# Patient Record
Sex: Male | Born: 1990 | Race: White | Hispanic: No | Marital: Single | Smoking: Current every day smoker
Health system: Southern US, Community
[De-identification: ages and names within clinical notes are randomized; demographics above are authoritative.]

## PROBLEM LIST (undated history)

## (undated) HISTORY — PX: OTHER SURGICAL HISTORY: SHX169

---
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
## 2010-01-26 ENCOUNTER — Emergency Department (HOSPITAL_COMMUNITY): Admission: EM | Admit: 2010-01-26 | Discharge: 2010-01-26 | Payer: Self-pay | Admitting: Emergency Medicine

## 2010-09-16 ENCOUNTER — Emergency Department (HOSPITAL_COMMUNITY)
Admission: EM | Admit: 2010-09-16 | Discharge: 2010-09-16 | Payer: Self-pay | Source: Home / Self Care | Admitting: Family Medicine

## 2011-02-18 LAB — POCT URINALYSIS DIPSTICK
Glucose, UA: NEGATIVE mg/dL
Hgb urine dipstick: NEGATIVE
Nitrite: NEGATIVE
Protein, ur: 100 mg/dL — AB
Specific Gravity, Urine: >=1.03 (ref 1.005–1.030)
Urobilinogen, UA: 4 mg/dL — ABNORMAL HIGH (ref 0.0–1.0)
pH: 6 (ref 5.0–8.0)

## 2011-02-24 LAB — URINALYSIS, ROUTINE W REFLEX MICROSCOPIC
Bilirubin Urine: NEGATIVE
Glucose, UA: NEGATIVE mg/dL
Hgb urine dipstick: NEGATIVE
Ketones, ur: NEGATIVE mg/dL
pH: 8 (ref 5.0–8.0)

## 2013-11-19 ENCOUNTER — Emergency Department (HOSPITAL_COMMUNITY): Payer: Self-pay

## 2013-11-19 ENCOUNTER — Emergency Department (HOSPITAL_COMMUNITY)
Admission: EM | Admit: 2013-11-19 | Discharge: 2013-11-19 | Disposition: A | Discharge status: Home / Self Care | Payer: Self-pay | Attending: Emergency Medicine | Admitting: Emergency Medicine

## 2013-11-19 DIAGNOSIS — IMO0002 Reserved for concepts with insufficient information to code with codable children: Secondary | ICD-10-CM | POA: Insufficient documentation

## 2013-11-19 DIAGNOSIS — Z881 Allergy status to other antibiotic agents status: Secondary | ICD-10-CM | POA: Insufficient documentation

## 2013-11-19 DIAGNOSIS — Y9389 Activity, other specified: Secondary | ICD-10-CM | POA: Insufficient documentation

## 2013-11-19 DIAGNOSIS — Y990 Civilian activity done for income or pay: Secondary | ICD-10-CM | POA: Insufficient documentation

## 2013-11-19 DIAGNOSIS — M25549 Pain in joints of unspecified hand: Secondary | ICD-10-CM | POA: Insufficient documentation

## 2013-11-19 DIAGNOSIS — M79641 Pain in right hand: Secondary | ICD-10-CM

## 2013-11-19 DIAGNOSIS — Z880 Allergy status to penicillin: Secondary | ICD-10-CM | POA: Insufficient documentation

## 2013-11-19 DIAGNOSIS — Y929 Unspecified place or not applicable: Secondary | ICD-10-CM | POA: Insufficient documentation

## 2013-11-19 DIAGNOSIS — M7989 Other specified soft tissue disorders: Secondary | ICD-10-CM | POA: Insufficient documentation

## 2013-11-19 NOTE — ED Provider Notes (Signed)
CSN: [id number]     Arrival date & time 11/19/13  1253 History   First MD Initiated Contact with Patient 11/19/13 1345    This chart was scribed for Grant Horseman PA-C, a non-physician practitioner working with Laray Anger, DO by Lewanda Rife, ED Scribe. This patient was seen in room TR07C/TR07C and the patient's care was started at 2:42 PM     Chief Complaint  Patient presents with  . Hand Pain   (Consider location/radiation/quality/duration/timing/severity/associated sxs/prior Treatment) The history is provided by the patient. No language interpreter was used.  HPI Comments: TEO MOEDE is a 22 y.o. male who presents to the Emergency Department complaining of constant moderate right hand pain onset today while using socket a wrench his hand slipped causing him to hit his hand on the ulnar aspect of affected hand. 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 Reports associated mild swelling. Reports pain is exacerbated with movement and alleviated by nothing. Denies associated fever, numbness, and other trauma.    No past medical history on file. No past surgical history on file. No family history on file. History  Substance Use Topics  . Smoking status: Not on file  . Smokeless tobacco: Not on file  . Alcohol Use: Not on file    Review of Systems  Constitutional: Negative for fever.  Musculoskeletal:       Right hand pain   Psychiatric/Behavioral: Negative for confusion.   A complete 10 system review of systems was obtained and all systems are negative except as noted in the HPI and PMHx.    Allergies  Amoxicillin and Penicillins  Home Medications   Current Outpatient Rx  Name  Route  Sig  Dispense  Refill  . naproxen sodium (ANAPROX) 220 MG tablet   Oral   Take 220 mg by mouth once.          BP 131/67  Pulse 82  Temp(Src) 98.1 F (36.7 C) (Oral)  Resp 16  Wt 228 lb 8 oz (103.647 kg)  SpO2 98% Physical Exam  Nursing note and vitals  reviewed. Constitutional: He is oriented to person, place, and time. He appears well-developed and well-nourished. No distress.  HENT:  Head: Normocephalic and atraumatic.  Eyes: EOM are normal.  Neck: Neck supple. No tracheal deviation present.  Cardiovascular: Normal rate, regular rhythm, intact distal pulses and normal pulses.   Pulses:      Radial pulses are 2+ on the right side.  Pulmonary/Chest: Effort normal. No respiratory distress.  Musculoskeletal: Normal range of motion.  ROM, strength 5/5, mild tenderness to palpation, no palpable deformity or abnormality  Neurological: He is alert and oriented to person, place, and time.  Sensation intact  Skin: Skin is warm and dry.  Psychiatric: He has a normal mood and affect. His behavior is normal.    ED Course  Procedures (including critical care time)  COORDINATION OF CARE:  Nursing notes reviewed. Vital signs reviewed. Initial pt interview and examination performed.   2:20 PM-Discussed work up plan with pt at bedside, which includes x-ray of right hand . Pt agrees with plan.  2:42 PM Nursing Notes Reviewed/ Care Coordinated Applicable Imaging Reviewed and incorporated into ED treatment Discussed results and treatment plan with pt. Pt demonstrates understanding and agrees with plan.  Treatment plan initiated:Medications - No data to display   Initial diagnostic testing ordered.    Labs Review Labs Reviewed - No data to display Imaging Review Dg Hand Complete Right  11/19/2013  CLINICAL DATA:  Work injury.  Pain.  EXAM: RIGHT HAND - COMPLETE 3+ VIEW  COMPARISON:  None.  FINDINGS: There is no evidence of fracture or dislocation. There is no evidence of arthropathy or other focal bone abnormality. Mild lateral soft tissue swelling.  IMPRESSION: Negative for fracture.   Electronically Signed   By: Davonna Belling M.D.   On: 11/19/2013 13:26    EKG Interpretation   None       MDM   1. Hand pain, right    Hand pain.   No fracture.  Wrist splint.  PCP follow-up.  I personally performed the services described in this documentation, which was scribed in my presence. The recorded information has been reviewed and is accurate.     Grant Horseman, PA-C 11/19/13 [phone]

## 2013-11-19 NOTE — ED Notes (Signed)
Hurt right hand at work today hurts to move hand now

## 2013-11-20 NOTE — ED Provider Notes (Signed)
Medical screening examination/treatment/procedure(s) were performed by non-physician practitioner and as supervising physician I was immediately available for consultation/collaboration.  EKG Interpretation   None         Laray Anger, DO 11/20/13 [phone]

## 2014-05-02 ENCOUNTER — Encounter (HOSPITAL_COMMUNITY): Payer: Self-pay | Admitting: Emergency Medicine

## 2014-05-02 ENCOUNTER — Emergency Department (HOSPITAL_COMMUNITY)
Admission: EM | Admit: 2014-05-02 | Discharge: 2014-05-03 | Disposition: A | Discharge status: Home / Self Care | Payer: Self-pay | Attending: Emergency Medicine | Admitting: Emergency Medicine

## 2014-05-02 DIAGNOSIS — Z791 Long term (current) use of non-steroidal anti-inflammatories (NSAID): Secondary | ICD-10-CM | POA: Insufficient documentation

## 2014-05-02 DIAGNOSIS — Y929 Unspecified place or not applicable: Secondary | ICD-10-CM | POA: Insufficient documentation

## 2014-05-02 DIAGNOSIS — IMO0002 Reserved for concepts with insufficient information to code with codable children: Secondary | ICD-10-CM

## 2014-05-02 DIAGNOSIS — W268XXA Contact with other sharp object(s), not elsewhere classified, initial encounter: Secondary | ICD-10-CM | POA: Insufficient documentation

## 2014-05-02 DIAGNOSIS — Z880 Allergy status to penicillin: Secondary | ICD-10-CM | POA: Insufficient documentation

## 2014-05-02 DIAGNOSIS — F17200 Nicotine dependence, unspecified, uncomplicated: Secondary | ICD-10-CM | POA: Insufficient documentation

## 2014-05-02 DIAGNOSIS — Y9389 Activity, other specified: Secondary | ICD-10-CM | POA: Insufficient documentation

## 2014-05-02 DIAGNOSIS — Z23 Encounter for immunization: Secondary | ICD-10-CM | POA: Insufficient documentation

## 2014-05-02 DIAGNOSIS — S61209A Unspecified open wound of unspecified finger without damage to nail, initial encounter: Secondary | ICD-10-CM | POA: Insufficient documentation

## 2014-05-02 DIAGNOSIS — Y990 Civilian activity done for income or pay: Secondary | ICD-10-CM | POA: Insufficient documentation

## 2014-05-02 NOTE — ED Notes (Signed)
Pt cut right thumb on piece of metal; no bleeding noted; edges well approximated. 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 Reports that he had a cut there previously. Done around 1800.

## 2014-05-03 ENCOUNTER — Encounter (HOSPITAL_COMMUNITY): Payer: Self-pay | Admitting: Emergency Medicine

## 2014-05-03 MED ORDER — TETANUS-DIPHTH-ACELL PERTUSSIS 5-2.5-18.5 LF-MCG/0.5 IM SUSP
0.5000 mL | Freq: Once | INTRAMUSCULAR | Status: AC
  Administered 2014-05-03: 0.5 mL via INTRAMUSCULAR
  Filled 2014-05-03: qty 0.5

## 2014-05-03 NOTE — ED Provider Notes (Signed)
CSN: [id number]     Arrival date & time 05/02/14  2041 History  This chart was scribed for non-physician practitioner Junious Silk, PA working with Junius Argyle, MD by Elveria Rising, ED Scribe. This patient was seen in room TR09C/TR09C and the patient's care was started at 12:22 AM.   Chief Complaint  Patient presents with  . Extremity Laceration     The history is provided by the patient. No language interpreter was used.   HPI Comments: Grant Evans is a 23 y.o. male who presents to the Emergency Department with laceration to right thumb which occured while at work today.  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 It scraped his skin, bumped up and then went back into his skin. Patient experiencing throbbing pain and intermittent tingling and numbness. Patient reports flushing the wound with water to expel any potential foreign bodies. He does not believe there are any retained foreign bodies.   Patient due for updated Tetanus vaccination.  History reviewed. No pertinent past medical history. History reviewed. No pertinent past surgical history. History reviewed. No pertinent family history. History  Substance Use Topics  . Smoking status: Current Every Day Smoker -- 1.00 packs/day  . Smokeless tobacco: Not on file  . Alcohol Use: Not on file    Review of Systems  Skin:       Laceration  All other systems reviewed and are negative.     Allergies  Amoxicillin and Penicillins  Home Medications   Prior to Admission medications   Medication Sig Start Date End Date Taking? Authorizing Provider  naproxen sodium (ANAPROX) 220 MG tablet Take 220 mg by mouth once.   Yes Historical Provider, MD   Triage Vitals: BP 122/65  Pulse 86  Temp(Src) 98.9 F (37.2 C) (Oral)  Resp 18  SpO2 99% Physical Exam  Nursing note and vitals reviewed. Constitutional: He is oriented to person, place, and time. He appears well-developed and  well-nourished. No distress.  HENT:  Head: Normocephalic and atraumatic.  Right Ear: External ear normal.  Left Ear: External ear normal.  Nose: Nose normal.  Eyes: Conjunctivae are normal.  Neck: Normal range of motion. No tracheal deviation present.  Cardiovascular: Normal rate, regular rhythm and normal heart sounds.   Pulmonary/Chest: Effort normal and breath sounds normal. No stridor.  Abdominal: Soft. He exhibits no distension. There is no tenderness.  Musculoskeletal: Normal range of motion.  Superficial laceration measuring 76mm in length. No foreign bodies appreciated. Bottom of the wound visualized. Cap refill < 3. Sensation intact. Full AROM.  Neurological: He is alert and oriented to person, place, and time.  Skin: Skin is warm and dry. He is not diaphoretic.  Psychiatric: He has a normal mood and affect. His behavior is normal.    ED Course  Procedures (including critical care time) DIAGNOSTIC STUDIES: Oxygen Saturation is 99% on room air, normal by my interpretation.    COORDINATION OF CARE: 12:24 AM- Discussed treatment plan with patient at bedside and patient agreed to plan.     Labs Review Labs Reviewed - No data to display  Imaging Review No results found.   EKG Interpretation None      MDM   Final diagnoses:  Laceration    Patient presents for evaluation of laceration. The laceration is very superficial. It measures approximately 5 mm in diameter. The bottle the wound was visualized and there no retained foreign bodies. I palpated the area  around the wound. The wound was cleaned here and the patient's tetanus shot was updated. Patient has full active range of motion and use of his hand. A work note was filled out for the patient per his request. Discussed reasons to return to the emergency department immediately. Vital signs stable for discharge. Patient / Family / Caregiver informed of clinical course, understand medical decision-making process, and  agree with plan.   I personally performed the services described in this documentation, which was scribed in my presence. The recorded information has been reviewed and is accurate.    Mora BellmanHannah S Kallan Bischoff, PA-C 05/03/14 [phone]

## 2014-05-03 NOTE — ED Provider Notes (Signed)
Medical screening examination/treatment/procedure(s) were performed by non-physician practitioner and as supervising physician I was immediately available for consultation/collaboration.   EKG Interpretation None        Junius Argyle, MD 05/03/14 0930

## 2014-05-03 NOTE — Discharge Instructions (Signed)

## 2014-05-03 NOTE — ED Notes (Signed)
Family member and pt at desk once again inquiring about the wait.  This Rn informed pt about process.  Family member rolling eyes and making rude remarks to RN, "y'all need more help around here.  This is ridiculous."  Pt and family member returned to room slamming door.

## 2014-05-03 NOTE — ED Notes (Signed)
PA at bedside.

## 2014-08-29 ENCOUNTER — Encounter (HOSPITAL_COMMUNITY): Payer: Self-pay | Admitting: Emergency Medicine

## 2014-08-29 DIAGNOSIS — F17200 Nicotine dependence, unspecified, uncomplicated: Secondary | ICD-10-CM | POA: Insufficient documentation

## 2014-08-29 DIAGNOSIS — R109 Unspecified abdominal pain: Secondary | ICD-10-CM | POA: Insufficient documentation

## 2014-08-29 DIAGNOSIS — R197 Diarrhea, unspecified: Secondary | ICD-10-CM | POA: Insufficient documentation

## 2014-08-29 DIAGNOSIS — K921 Melena: Secondary | ICD-10-CM | POA: Insufficient documentation

## 2014-08-29 DIAGNOSIS — Z880 Allergy status to penicillin: Secondary | ICD-10-CM | POA: Insufficient documentation

## 2014-08-29 LAB — COMPREHENSIVE METABOLIC PANEL
ALBUMIN: 4.1 g/dL (ref 3.5–5.2)
ALK PHOS: 51 U/L (ref 39–117)
ALT: 21 U/L (ref 0–53)
AST: 24 U/L (ref 0–37)
Anion gap: 15 (ref 5–15)
BUN: 12 mg/dL (ref 6–23)
CALCIUM: 9.2 mg/dL (ref 8.4–10.5)
CO2: 24 mEq/L (ref 19–32)
Chloride: 103 mEq/L (ref 96–112)
Creatinine, Ser: 0.86 mg/dL (ref 0.50–1.35)
GFR calc non Af Amer: >90 mL/min (ref >90)
GLUCOSE: 118 mg/dL — AB (ref 70–99)
POTASSIUM: 3.5 meq/L — AB (ref 3.7–5.3)
SODIUM: 142 meq/L (ref 137–147)
TOTAL PROTEIN: 7.6 g/dL (ref 6.0–8.3)
Total Bilirubin: 0.4 mg/dL (ref 0.3–1.2)

## 2014-08-29 LAB — URINALYSIS, ROUTINE W REFLEX MICROSCOPIC
Bilirubin Urine: NEGATIVE
GLUCOSE, UA: NEGATIVE mg/dL
HGB URINE DIPSTICK: NEGATIVE
Ketones, ur: NEGATIVE mg/dL
LEUKOCYTES UA: NEGATIVE
Nitrite: NEGATIVE
PH: 8 (ref 5.0–8.0)
Protein, ur: NEGATIVE mg/dL
SPECIFIC GRAVITY, URINE: 1.011 (ref 1.005–1.030)
Urobilinogen, UA: 0.2 mg/dL (ref 0.0–1.0)

## 2014-08-29 LAB — CBC WITH DIFFERENTIAL/PLATELET
BASOS ABS: 0 10*3/uL (ref 0.0–0.1)
Basophils Relative: 0 % (ref 0–1)
EOS ABS: 0.1 10*3/uL (ref 0.0–0.7)
Eosinophils Relative: 1 % (ref 0–5)
HCT: 42.8 % (ref 39.0–52.0)
Hemoglobin: 14.8 g/dL (ref 13.0–17.0)
LYMPHS ABS: 2 10*3/uL (ref 0.7–4.0)
LYMPHS PCT: 21 % (ref 12–46)
MCH: 30 pg (ref 26.0–34.0)
MCHC: 34.6 g/dL (ref 30.0–36.0)
MCV: 86.8 fL (ref 78.0–100.0)
Monocytes Absolute: 0.5 10*3/uL (ref 0.1–1.0)
Monocytes Relative: 6 % (ref 3–12)
NEUTROS PCT: 72 % (ref 43–77)
Neutro Abs: 6.7 10*3/uL (ref 1.7–7.7)
PLATELETS: 193 10*3/uL (ref 150–400)
RBC: 4.93 MIL/uL (ref 4.22–5.81)
RDW: 12.9 % (ref 11.5–15.5)
WBC: 9.3 10*3/uL (ref 4.0–10.5)

## 2014-08-29 LAB — LIPASE, BLOOD: Lipase: 18 U/L (ref 11–59)

## 2014-08-29 NOTE — ED Notes (Signed)
The pt is c/o abd pain for 4 weeks or so.  Diarrhea since then and he has blood in his diarrhea

## 2014-08-30 ENCOUNTER — Emergency Department (HOSPITAL_COMMUNITY)
Admission: EM | Admit: 2014-08-30 | Discharge: 2014-08-30 | Disposition: A | Discharge status: Home / Self Care | Payer: Self-pay | Attending: Emergency Medicine | Admitting: Emergency Medicine

## 2014-08-30 DIAGNOSIS — R109 Unspecified abdominal pain: Secondary | ICD-10-CM

## 2014-08-30 DIAGNOSIS — R197 Diarrhea, unspecified: Secondary | ICD-10-CM

## 2014-08-30 NOTE — Discharge Instructions (Signed)
Monitor which types of food brings on your symptoms and diarrhea. Hold 1 types of food such as gluten or dairy and see if this improves for one month.  If you were given medicines take as directed.  If you are on coumadin or contraceptives realize their levels and effectiveness is altered by many different medicines.  If you have any reaction (rash, tongues swelling, other) to the medicines stop taking and see a physician.   Please follow up as directed and return to the ER or see a physician for new or worsening symptoms.  Thank you. Filed Vitals:   08/29/14 2240  BP: 125/63  Pulse: 96  Temp: 99 F (37.2 C)  Resp: 18  Height:  (1.778 m)  Weight: 223 lb (101.152 kg)  SpO2: 95%

## 2014-08-30 NOTE — ED Provider Notes (Signed)
CSN: [id number]     Arrival date & time 08/29/14  2232 History   First MD Initiated Contact with Patient 08/30/14 0020     Chief Complaint  Patient presents with  . Abdominal Pain     (Consider location/radiation/quality/duration/timing/severity/associated sxs/prior Treatment) HPI Comments: 23 year old male with no significant medical history, no alcohol, mild smoker, no abdominal surgery or colonoscopy history, no family history of inflammatory bowel disease presents with intermittent abdominal cramping and diarrhea. Patient's had this for 3-4 weeks and feels it happens after he is an argument or stress. Patient unsure which foods worsen. Patient has mild blood in some of diarrhea but denies lightheadedness or syncope. No colonoscopy history. Patient denies recent travel or antibiotics. No sick contacts.  Patient is a 23 y.o. male presenting with abdominal pain. The history is provided by the patient.  Abdominal Pain Associated symptoms: diarrhea   Associated symptoms: no chest pain, no chills, no dysuria, no fever, no nausea, no shortness of breath and no vomiting     History reviewed. No pertinent past medical history. History reviewed. No pertinent past surgical history. No family history on file. History  Substance Use Topics  . Smoking status: Current Every Day Smoker -- 1.00 packs/day  . Smokeless tobacco: Not on file  . Alcohol Use: No    Review of Systems  Constitutional: Negative for fever and chills.  HENT: Negative for congestion.   Eyes: Negative for visual disturbance.  Respiratory: Negative for shortness of breath.   Cardiovascular: Negative for chest pain.  Gastrointestinal: Positive for abdominal pain, diarrhea and blood in stool. Negative for nausea and vomiting.  Genitourinary: Negative for dysuria and flank pain.  Musculoskeletal: Negative for back pain, neck pain and neck stiffness.  Skin: Negative for rash.  Neurological: Negative for light-headedness and  headaches.      Allergies  Amoxicillin and Penicillins  Home Medications   Prior to Admission medications   Not on File   BP 125/63  Pulse 96  Temp(Src) 99 F (37.2 C)  Resp 18  Ht  (1.778 m)  Wt 223 lb (101.152 kg)  BMI 32.00 kg/m2  SpO2 95% Physical Exam  Nursing note and vitals reviewed. Constitutional: He is oriented to person, place, and time. He appears well-developed and well-nourished.  HENT:  Head: Normocephalic and atraumatic.  Eyes: Conjunctivae are normal. Right eye exhibits no discharge. Left eye exhibits no discharge.  Neck: Normal range of motion. Neck supple. No tracheal deviation present.  Cardiovascular: Normal rate and regular rhythm.   Pulmonary/Chest: Effort normal and breath sounds normal.  Abdominal: Soft. He exhibits no distension. There is no tenderness. There is no guarding.  Musculoskeletal: He exhibits no edema.  Neurological: He is alert and oriented to person, place, and time.  Skin: Skin is warm. No rash noted.  Psychiatric: He has a normal mood and affect.    ED Course  Procedures (including critical care time) Labs Review Labs Reviewed  COMPREHENSIVE METABOLIC PANEL - Abnormal; Notable for the following:    Potassium 3.5 (*)    Glucose, Bld 118 (*)    All other components within normal limits  URINALYSIS, ROUTINE W REFLEX MICROSCOPIC - Abnormal; Notable for the following:    APPearance HAZY (*)    All other components within normal limits  CBC WITH DIFFERENTIAL  LIPASE, BLOOD    Imaging Review No results found.   EKG Interpretation None      MDM   Final diagnoses:  Diarrhea  Abdominal cramping  Well-appearing healthy male with intermittent diarrhea for 3-4 weeks. No pain on abdominal exam. No active bleeding in ER. Discussed followup outpatient with primary Dr. and GI. No indication for CT scan this time. Blood work unremarkable. Patient asking for work note.    Enid Skeens, MD 08/30/14 [phone]

## 2014-08-30 NOTE — ED Notes (Signed)
Pt A&OX4, ambulatory at d/c with steady gait, NAD 

## 2014-09-04 ENCOUNTER — Encounter (HOSPITAL_COMMUNITY): Payer: Self-pay | Admitting: Emergency Medicine

## 2014-09-04 ENCOUNTER — Emergency Department (HOSPITAL_COMMUNITY)
Admission: EM | Admit: 2014-09-04 | Discharge: 2014-09-04 | Disposition: A | Discharge status: Home / Self Care | Payer: No Typology Code available for payment source | Attending: Emergency Medicine | Admitting: Emergency Medicine

## 2014-09-04 DIAGNOSIS — S0990XA Unspecified injury of head, initial encounter: Secondary | ICD-10-CM | POA: Insufficient documentation

## 2014-09-04 DIAGNOSIS — S060X0A Concussion without loss of consciousness, initial encounter: Secondary | ICD-10-CM | POA: Insufficient documentation

## 2014-09-04 DIAGNOSIS — S139XXA Sprain of joints and ligaments of unspecified parts of neck, initial encounter: Secondary | ICD-10-CM | POA: Insufficient documentation

## 2014-09-04 DIAGNOSIS — Y92410 Unspecified street and highway as the place of occurrence of the external cause: Secondary | ICD-10-CM | POA: Insufficient documentation

## 2014-09-04 DIAGNOSIS — Y9389 Activity, other specified: Secondary | ICD-10-CM | POA: Diagnosis not present

## 2014-09-04 DIAGNOSIS — S0120XA Unspecified open wound of nose, initial encounter: Secondary | ICD-10-CM | POA: Insufficient documentation

## 2014-09-04 DIAGNOSIS — S161XXA Strain of muscle, fascia and tendon at neck level, initial encounter: Secondary | ICD-10-CM

## 2014-09-04 DIAGNOSIS — Z880 Allergy status to penicillin: Secondary | ICD-10-CM | POA: Insufficient documentation

## 2014-09-04 DIAGNOSIS — F17200 Nicotine dependence, unspecified, uncomplicated: Secondary | ICD-10-CM | POA: Insufficient documentation

## 2014-09-04 DIAGNOSIS — M62838 Other muscle spasm: Secondary | ICD-10-CM | POA: Diagnosis not present

## 2014-09-04 MED ORDER — CYCLOBENZAPRINE HCL 10 MG PO TABS: 10.0000 mg | ORAL_TABLET | Freq: Three times a day (TID) | ORAL | Status: DC | PRN

## 2014-09-04 MED ORDER — IBUPROFEN 600 MG PO TABS: 600.0000 mg | ORAL_TABLET | Freq: Three times a day (TID) | ORAL | Status: DC | PRN

## 2014-09-04 MED ORDER — HYDROCODONE-ACETAMINOPHEN 5-325 MG PO TABS: 1.0000 | ORAL_TABLET | Freq: Four times a day (QID) | ORAL | Status: DC | PRN

## 2014-09-04 NOTE — Discharge Instructions (Signed)
Take ibuprofen as directed for inflammation and pain with norco for breakthrough pain and flexeril for muscle relaxation. Do not drive or operate machinery with pain medication or muscle relaxation use. Ice to areas of soreness for the next few days and then may move to heat, no more than 20 minutes at a time for each. Get plenty of rest, use ice on your head.  Stay in a quiet, not simulating, dark environment. No TV, computer use, video games, etc until headache is resolved completely. No contact sports until cleared by your doctor. Follow Up with primary care physician in 3-4 days if headache persists.  Expect to be sore for the next few day and follow up with primary care physician for recheck of ongoing symptoms. Return to the emergency department if patient becomes lethargic, begins vomiting or other change in mental status, or for changes or worsening symptoms.   Cervical Sprain A cervical sprain is when the tissues (ligaments) that hold the neck bones in place stretch or tear. HOME CARE   Put ice on the injured area.  Put ice in a plastic bag.  Place a towel between your skin and the bag.  Leave the ice on for 15-20 minutes, 3-4 times a day.  You may have been given a collar to wear. This collar keeps your neck from moving while you heal.  Do not take the collar off unless told by your doctor.  If you have long hair, keep it outside of the collar.  Ask your doctor before changing the position of your collar. You may need to change its position over time to make it more comfortable.  If you are allowed to take off the collar for cleaning or bathing, follow your doctor's instructions on how to do it safely.  Keep your collar clean by wiping it with mild soap and water. Dry it completely. If the collar has removable pads, remove them every 1-2 days to hand wash them with soap and water. Allow them to air dry. They should be dry before you wear them in the collar.  Do not drive while  wearing the collar.  Only take medicine as told by your doctor.  Keep all doctor visits as told.  Keep all physical therapy visits as told.  Adjust your work station so that you have good posture while you work.  Avoid positions and activities that make your problems worse.  Warm up and stretch before being active. GET HELP IF:  Your pain is not controlled with medicine.  You cannot take less pain medicine over time as planned.  Your activity level does not improve as expected. GET HELP RIGHT AWAY IF:   You are bleeding.  Your stomach is upset.  You have an allergic reaction to your medicine.  You develop new problems that you cannot explain.  You lose feeling (become numb) or you cannot move any part of your body (paralysis).  You have tingling or weakness in any part of your body.  Your symptoms get worse. Symptoms include:  Pain, soreness, stiffness, puffiness (swelling), or a burning feeling in your neck.  Pain when your neck is touched.  Shoulder or upper back pain.  Limited ability to move your neck.  Headache.  Dizziness.  Your hands or arms feel week, lose feeling, or tingle.  Muscle spasms.  Difficulty swallowing or chewing. MAKE SURE YOU:   Understand these instructions.  Will watch your condition.  Will get help right away if you are not  doing well or get worse. Document Released: 05/10/2008 Document Revised: 07/25/2013 Document Reviewed: 05/30/2013 [facility] Patient Information 2015 [place], [place]. This information is not intended to replace advice given to you by your health care provider. Make sure you discuss any questions you have with your health care provider.  Concussion A concussion is a brain injury. It is caused by:  A hit to the head.  A quick and sudden movement (jolt) of the head or neck. A concussion is usually not life threatening. Even so, it can cause serious problems. If you had a concussion before, you may have  concussion-like problems after a hit to your head. HOME CARE General Instructions  Follow your doctor's directions carefully.  Take medicines only as told by your doctor.  Only take medicines your doctor says are safe.  Do not drink alcohol until your doctor says it is okay. Alcohol and some drugs can slow down healing. They can also put you at risk for further injury.  If you are having trouble remembering things, write them down.  Try to do one thing at a time if you get distracted easily. For example, do not watch TV while making dinner.  Talk to your family members or close friends when making important decisions.  Follow up with your doctor as told.  Watch your symptoms. Tell others to do the same. Serious problems can sometimes happen after a concussion. Older adults are more likely to have these problems.  Tell your teachers, school nurse, school counselor, coach, Event organiser, or work Production designer, theatre/television/film about your concussion. Tell them about what you can or cannot do. They should watch to see if:  It gets even harder for you to pay attention or concentrate.  It gets even harder for you to remember things or learn new things.  You need more time than normal to finish things.  You become annoyed (irritable) more than before.  You are not able to deal with stress as well.  You have more problems than before.  Rest. Make sure you:  Get plenty of sleep at night.  Go to sleep early.  Go to bed at the same time every day. Try to wake up at the same time.  Rest during the day.  Take naps when you feel tired.  Limit activities where you have to think a lot or concentrate. These include:  Doing homework.  Doing work related to a job.  Watching TV.  Using the computer. Returning To Your Regular Activities Return to your normal activities slowly, not all at once. You must give your body and brain enough time to heal.   Do not play sports or do other athletic  activities until your doctor says it is okay.  Ask your doctor when you can drive, ride a bicycle, or work other vehicles or machines. Never do these things if you feel dizzy.  Ask your doctor about when you can return to work or school. Preventing Another Concussion It is very important to avoid another brain injury, especially before you have healed. In rare cases, another injury can lead to permanent brain damage, brain swelling, or death. The risk of this is greatest during the first 7-10 days after your injury. Avoid injuries by:   Wearing a seat belt when riding in a car.  Not drinking too much alcohol.  Avoiding activities that could lead to a second concussion (such as contact sports).  Wearing a helmet when doing activities like:  Biking.  Skiing.  Skateboarding.  Skating.  Making your home safer by:  Removing things from the floor or stairways that could make you trip.  Using grab bars in bathrooms and handrails by stairs.  Placing non-slip mats on floors and in bathtubs.  Improve lighting in dark areas. GET HELP IF:  It gets even harder for you to pay attention or concentrate.  It gets even harder for you to remember things or learn new things.  You need more time than normal to finish things.  You become annoyed (irritable) more than before.  You are not able to deal with stress as well.  You have more problems than before.  You have problems keeping your balance.  You are not able to react quickly when you should. Get help if you have any of these problems for more than 2 weeks:   Lasting (chronic) headaches.  Dizziness or trouble balancing.  Feeling sick to your stomach (nausea).  Seeing (vision) problems.  Being affected by noises or light more than normal.  Feeling sad, low, down in the dumps, blue, gloomy, or empty (depressed).  Mood changes (mood swings).  Feeling of fear or nervousness about what may happen (anxiety).  Feeling  annoyed.  Memory problems.  Problems concentrating or paying attention.  Sleep problems.  Feeling tired all the time. GET HELP RIGHT AWAY IF:   You have bad headaches or your headaches get worse.  You have weakness (even if it is in one hand, leg, or part of the face).  You have loss of feeling (numbness).  You feel off balance.  You keep throwing up (vomiting).  You feel tired.  One black center of your eye (pupil) is larger than the other.  You twitch or shake violently (convulse).  Your speech is not clear (slurred).  You are more confused, easily angered (agitated), or annoyed than before.  You have more trouble resting than before.  You are unable to recognize people or places.  You have neck pain.  It is difficult to wake you up.  You have unusual behavior changes.  You pass out (lose consciousness). MAKE SURE YOU:   Understand these instructions.  Will watch your condition.  Will get help right away if you are not doing well or get worse. Document Released: 11/10/2009 Document Revised: 04/08/2014 Document Reviewed: 06/14/2013 [facility] Patient Information 2015 [place], [place]. This information is not intended to replace advice given to you by your health care provider. Make sure you discuss any questions you have with your health care provider.  Cryotherapy Cryotherapy means treatment with cold. Ice or gel packs can be used to reduce both pain and swelling. Ice is the most helpful within the first 24 to 48 hours after an injury or flare-up from overusing a muscle or joint. Sprains, strains, spasms, burning pain, shooting pain, and aches can all be eased with ice. Ice can also be used when recovering from surgery. Ice is effective, has very few side effects, and is safe for most people to use. PRECAUTIONS  Ice is not a safe treatment option for people with:  Raynaud phenomenon. This is a condition affecting small blood vessels in the extremities. Exposure  to cold may cause your problems to return.  Cold hypersensitivity. There are many forms of cold hypersensitivity, including:  Cold urticaria. Red, itchy hives appear on the skin when the tissues begin to warm after being iced.  Cold erythema. This is a red, itchy rash caused by exposure to cold.  Cold hemoglobinuria. Red blood  cells break down when the tissues begin to warm after being iced. The hemoglobin that carry oxygen are passed into the urine because they cannot combine with blood proteins fast enough.  Numbness or altered sensitivity in the area being iced. If you have any of the following conditions, do not use ice until you have discussed cryotherapy with your caregiver:  Heart conditions, such as arrhythmia, angina, or chronic heart disease.  High blood pressure.  Healing wounds or open skin in the area being iced.  Current infections.  Rheumatoid arthritis.  Poor circulation.  Diabetes. Ice slows the blood flow in the region it is applied. This is beneficial when trying to stop inflamed tissues from spreading irritating chemicals to surrounding tissues. However, if you expose your skin to cold temperatures for too long or without the proper protection, you can damage your skin or nerves. Watch for signs of skin damage due to cold. HOME CARE INSTRUCTIONS Follow these tips to use ice and cold packs safely.  Place a dry or damp towel between the ice and skin. A damp towel will cool the skin more quickly, so you may need to shorten the time that the ice is used.  For a more rapid response, add gentle compression to the ice.  Ice for no more than 10 to 20 minutes at a time. The bonier the area you are icing, the less time it will take to get the benefits of ice.  Check your skin after 5 minutes to make sure there are no signs of a poor response to cold or skin damage.  Rest 20 minutes or more between uses.  Once your skin is numb, you can end your treatment. You can  test numbness by very lightly touching your skin. The touch should be so light that you do not see the skin dimple from the pressure of your fingertip. When using ice, most people will feel these normal sensations in this order: cold, burning, aching, and numbness.  Do not use ice on someone who cannot communicate their responses to pain, such as small children or people with dementia. HOW TO MAKE AN ICE PACK Ice packs are the most common way to use ice therapy. Other methods include ice massage, ice baths, and cryosprays. Muscle creams that cause a cold, tingly feeling do not offer the same benefits that ice offers and should not be used as a substitute unless recommended by your caregiver. To make an ice pack, do one of the following:  Place crushed ice or a bag of frozen vegetables in a sealable plastic bag. Squeeze out the excess air. Place this bag inside another plastic bag. Slide the bag into a pillowcase or place a damp towel between your skin and the bag.  Mix 3 parts water with 1 part rubbing alcohol. Freeze the mixture in a sealable plastic bag. When you remove the mixture from the freezer, it will be slushy. Squeeze out the excess air. Place this bag inside another plastic bag. Slide the bag into a pillowcase or place a damp towel between your skin and the bag. SEEK MEDICAL CARE IF:  You develop white spots on your skin. This may give the skin a blotchy (mottled) appearance.  Your skin turns blue or pale.  Your skin becomes waxy or hard.  Your swelling gets worse. MAKE SURE YOU:   Understand these instructions.  Will watch your condition.  Will get help right away if you are not doing well or get worse. Document  Released: 07/19/2011 Document Revised: 04/08/2014 Document Reviewed: 07/19/2011 [facility] Patient Information 2015 [place], [place]. This information is not intended to replace advice given to you by your health care provider. Make sure you discuss any questions you have  with your health care provider.  Heat Therapy Heat therapy can help ease sore, stiff, injured, and tight muscles and joints. Heat relaxes your muscles, which may help ease your pain.  RISKS AND COMPLICATIONS If you have any of the following conditions, do not use heat therapy unless your health care provider has approved:  Poor circulation.  Healing wounds or scarred skin in the area being treated.  Diabetes, heart disease, or high blood pressure.  Not being able to feel (numbness) the area being treated.  Unusual swelling of the area being treated.  Active infections.  Blood clots.  Cancer.  Inability to communicate pain. This may include young children and people who have problems with their brain function (dementia).  Pregnancy. Heat therapy should only be used on old, pre-existing, or long-lasting (chronic) injuries. Do not use heat therapy on new injuries unless directed by your health care provider. HOW TO USE HEAT THERAPY There are several different kinds of heat therapy, including:  Moist heat pack.  Warm water bath.  Hot water bottle.  Electric heating pad.  Heated gel pack.  Heated wrap.  Electric heating pad. Use the heat therapy method suggested by your health care provider. Follow your health care provider's instructions on when and how to use heat therapy. GENERAL HEAT THERAPY RECOMMENDATIONS  Do not sleep while using heat therapy. Only use heat therapy while you are awake.  Your skin may turn pink while using heat therapy. Do not use heat therapy if your skin turns red.  Do not use heat therapy if you have new pain.  High heat or long exposure to heat can cause burns. Be careful when using heat therapy to avoid burning your skin.  Do not use heat therapy on areas of your skin that are already irritated, such as with a rash or sunburn. SEEK MEDICAL CARE IF:  You have blisters, redness, swelling, or numbness.  You have new pain.  Your pain is  worse. MAKE SURE YOU:  Understand these instructions.  Will watch your condition.  Will get help right away if you are not doing well or get worse. Document Released: 02/14/2012 Document Revised: 04/08/2014 Document Reviewed: 01/15/2014 [facility] Patient Information 2015 [place], [place]. This information is not intended to replace advice given to you by your health care provider. Make sure you discuss any questions you have with your health care provider. Emergency Department Resource Guide 1) Find a Doctor and Pay Out of Pocket Although you won't have to find out who is covered by your insurance plan, it is a good idea to ask around and get recommendations. You will then need to call the office and see if the doctor you have chosen will accept you as a new patient and what types of options they offer for patients who are self-pay. Some doctors offer discounts or will set up payment plans for their patients who do not have insurance, but you will need to ask so you aren't surprised when you get to your appointment.  2) Contact Your Local Health Department Not all health departments have doctors that can see patients for sick visits, but many do, so it is worth a call to see if yours does. If you don't know where your local health department is, you  can check in your phone book. The CDC also has a tool to help you locate your state's health department, and many state websites also have listings of all of their local health departments.  3) Find a Walk-in Clinic If your illness is not likely to be very severe or complicated, you may want to try a walk in clinic. These are popping up all over the country in pharmacies, drugstores, and shopping centers. They're usually staffed by nurse practitioners or physician assistants that have been trained to treat common illnesses and complaints. They're usually fairly quick and inexpensive. However, if you have serious medical issues or chronic medical problems,  these are probably not your best option.  No Primary Care Doctor: - Call [facility] at  [phone] - they can help you locate a primary care doctor that  accepts your insurance, provides certain services, etc. - Physician Referral Service- [phone]  Chronic Pain Problems: Organization         Address  Phone   Notes  [facility]  [phone] Patients need to be referred by their primary care doctor.   Medication Assistance: Organization         Address  Phone   Notes  [facility] Medication [facility] [address] [place] [place], [place] [address] [phone] --Must be a resident of [facility] -- Must have NO insurance coverage whatsoever (no Medicaid/ Medicare, etc.) -- The pt. MUST have a primary care doctor that directs their care regularly and follows them in the community   [facility]  [phone]   [employer]  [phone]    Agencies that provide inexpensive medical care: Organization         Address  Phone   Notes  [facility]  [phone]   [facility]    [phone]   [facility] [address] [place], [place] [address] [phone]   [facility] [address], [place] [phone]   [facility]    [phone]   [facility]    [phone]   [facility]  [address], [place] Phone:  [phone], Fax:  [phone] Hours of Operation:  9 am - 6 pm, M-F.  Also accepts Medicaid/Medicare and self-pay.  [facility]  [address], [place] Phone: [phone], Fax: [phone]. Hours of Operation:  8:30 am - 5:30 pm, M-F.  Also accepts Medicaid and self-pay.  [facility] [place] [address], [place] Phone: [phone]   [facility] [address] Natasha Bence [place], [place] [phone], Ext. [phone] Mondays &  Thursdays: 7-9 AM.  First 15 patients are seen on a first come, first serve basis.    Medicaid-accepting [facility] Providers:  Organization         Address  Phone   Notes  [facility] [address],  [phone] Also accepts self-pay patients.  [facility] [address] Laurell Josephs [place], [place]  [phone]   [facility] [address], [place] [phone]   [facility] [address] [place] [phone]   Renaye Rakers [address], [place]   [phone] Only accepts [place] patients after they have their name applied  to their card.   Self-Pay (no insurance) in [facility] [place]:  Organization         Address  Phone   Notes  Sickle Cell Patients, [facility] [address], [place] [phone]) [phone]   [facility] [address], [place] [phone]) [phone]   [facility]  [address], [place] [phone]   [facility]/Dr. Osei-Bonsu  [address] [place] [address], [place] or [phone], [place] [phone] Phone number for both [place] and [place] locations is the same.  [facility] [address], [place] [phone]   [facility] [address] [place] [address], [place] or [address] [phone]   [facility] [address], [place] [phone], phone; [phone]) [phone], fax Sees patients 1st and 3rd Saturday of every month.  Must not qualify for public or private insurance (i.e. Medicaid, Medicare, [place] Health Choice, Veterans' Benefits)  Household income should be no more than 200% of the poverty level The clinic cannot treat you if you are pregnant or think you are pregnant  Sexually transmitted diseases are not treated at the  clinic.
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
## 2014-09-05 NOTE — ED Provider Notes (Signed)
Medical screening examination/treatment/procedure(s) were performed by non-physician practitioner and as supervising physician I was immediately available for consultation/collaboration.   EKG Interpretation None       Vanetta MuldersScott Solon Alban, MD 09/05/14 [id number]

## 2015-02-04 ENCOUNTER — Emergency Department (HOSPITAL_COMMUNITY)
Admission: EM | Admit: 2015-02-04 | Discharge: 2015-02-04 | Discharge status: Against Medical Advice | Payer: Self-pay | Attending: Emergency Medicine | Admitting: Emergency Medicine

## 2015-02-04 ENCOUNTER — Encounter (HOSPITAL_COMMUNITY): Payer: Self-pay | Admitting: Emergency Medicine

## 2015-02-04 DIAGNOSIS — M545 Low back pain: Secondary | ICD-10-CM | POA: Insufficient documentation

## 2015-02-04 DIAGNOSIS — Z720 Tobacco use: Secondary | ICD-10-CM | POA: Insufficient documentation

## 2015-02-04 NOTE — ED Notes (Signed)
Pt states he is having lower back pain that started on superbowl Sunday and since it then it comes and goes  Pt states it usually with hurt a couple hours then go away but today it has been steady since about 5pm

## 2017-05-19 ENCOUNTER — Emergency Department (HOSPITAL_COMMUNITY)
Admission: EM | Admit: 2017-05-19 | Discharge: 2017-05-19 | Disposition: A | Discharge status: Home / Self Care | Payer: Self-pay | Attending: Emergency Medicine | Admitting: Emergency Medicine

## 2017-05-19 ENCOUNTER — Encounter (HOSPITAL_COMMUNITY): Payer: Self-pay | Admitting: Emergency Medicine

## 2017-05-19 ENCOUNTER — Emergency Department (HOSPITAL_COMMUNITY): Payer: Self-pay

## 2017-05-19 DIAGNOSIS — F17200 Nicotine dependence, unspecified, uncomplicated: Secondary | ICD-10-CM | POA: Insufficient documentation

## 2017-05-19 DIAGNOSIS — Y999 Unspecified external cause status: Secondary | ICD-10-CM | POA: Insufficient documentation

## 2017-05-19 DIAGNOSIS — S40012A Contusion of left shoulder, initial encounter: Secondary | ICD-10-CM

## 2017-05-19 DIAGNOSIS — W11XXXA Fall on and from ladder, initial encounter: Secondary | ICD-10-CM | POA: Insufficient documentation

## 2017-05-19 DIAGNOSIS — M67912 Unspecified disorder of synovium and tendon, left shoulder: Secondary | ICD-10-CM

## 2017-05-19 DIAGNOSIS — Y929 Unspecified place or not applicable: Secondary | ICD-10-CM | POA: Insufficient documentation

## 2017-05-19 DIAGNOSIS — Y939 Activity, unspecified: Secondary | ICD-10-CM | POA: Insufficient documentation

## 2017-05-19 DIAGNOSIS — Z880 Allergy status to penicillin: Secondary | ICD-10-CM | POA: Insufficient documentation

## 2017-05-19 NOTE — ED Notes (Signed)
No jewelry on left hand

## 2017-05-19 NOTE — Discharge Instructions (Signed)
Next several days.  Continue taking Tylenol and Advil/Motrin for discomfort, You have been given some rehabilitation exercises that you can start in the next 5-7 days.  You've been given a referral to orthopedics if you see no improvement

## 2017-05-19 NOTE — ED Notes (Signed)
Ice pack given to pt & applied to left shoulder

## 2017-05-19 NOTE — ED Triage Notes (Signed)
Pt c/o L shoulder pain after falling from 586ft ladder @ 1400 yesterday, pt reports shoulder has good ROM but he can hear "cracks and pops". +CMS

## 2017-05-19 NOTE — ED Provider Notes (Signed)
[facility]-EMERGENCY DEPT Provider Note   CSN: [id number] Arrival date & time: 05/19/17  0058     History   Chief Complaint Chief Complaint  Patient presents with  . Shoulder Pain    HPI Grant Evans is a 26 y.o. male.  This a 26 year old male.  He's been having some pain and restricted movement in his left shoulder for "a while."  Last evening.  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  Again, injuring his left shoulder.  He did Advil with some relief of his discomfort, but is concerned because of the limited range of motion, stating that it hurts when he lifts his arm above his head. Denies any numbness or tingling to the arm.  No discomfort in the elbow or wrist      History reviewed. No pertinent past medical history.  There are no active problems to display for this patient.   Past Surgical History:  Procedure Laterality Date  . extraction of wisdom teeth         Home Medications    Prior to Admission medications   Medication Sig Start Date End Date Taking? Authorizing Provider  cyclobenzaprine (FLEXERIL) 10 MG tablet Take 1 tablet (10 mg total) by mouth 3 (three) times daily as needed for muscle spasms. 09/04/14   Street, [place], PA-C  HYDROcodone-acetaminophen (NORCO) 5-325 MG per tablet Take 1-2 tablets by mouth every 6 (six) hours as needed for severe pain. 09/04/14   Street, [place], PA-C  ibuprofen (ADVIL,MOTRIN) 600 MG tablet Take 1 tablet (600 mg total) by mouth every 8 (eight) hours as needed for fever, headache, moderate pain or cramping. 09/04/14   Street, [place], PA-C  Naproxen Sodium (ALEVE) 220 MG CAPS Take 1 capsule by mouth daily as needed. For bad headache    [provider]    Family History Family History  Problem Relation Age of Onset  . CAD Other     Social History Social History  Substance Use Topics  . Smoking status: Current Every Day Smoker   Packs/day: 1.00  . Smokeless tobacco: Never Used  . Alcohol use No     Allergies   Amoxicillin and Penicillins   Review of Systems Review of Systems  Constitutional: Negative for fever.  Musculoskeletal: Positive for arthralgias.  Neurological: Negative for weakness and numbness.  All other systems reviewed and are negative.    Physical Exam Updated Vital Signs BP 121/69 (BP Location: Left Arm)   Pulse 81   Temp 98.4 F (36.9 C) (Oral)   Resp 16   Ht 5\' 9"  (1.753 m)   Wt 104.3 kg (230 lb)   SpO2 99%   BMI 33.97 kg/m   Physical Exam  Constitutional: He appears well-developed and well-nourished. No distress.  HENT:  Head: Normocephalic.  Eyes: Pupils are equal, round, and reactive to light.  Neck: Normal range of motion.  Cardiovascular: Normal rate.   Pulmonary/Chest: Effort normal.  Abdominal: Soft.  Musculoskeletal: He exhibits tenderness.       Left shoulder: He exhibits decreased range of motion, tenderness and pain. He exhibits no swelling, no effusion, no crepitus, no deformity, no laceration, no spasm, normal pulse and normal strength.  Neurological: He is alert.  Skin: Skin is warm.  Nursing note and vitals reviewed.    ED Treatments / Results  Labs (all labs ordered are listed, but only abnormal results are displayed) Labs Reviewed - No  data to display  EKG  EKG Interpretation None       Radiology Dg Shoulder Left  Result Date: 05/19/2017 CLINICAL DATA:  Larey Seat 6 feet from ladder yesterday.  Pain. EXAM: LEFT SHOULDER - 2+ VIEW COMPARISON:  None. FINDINGS: The humeral head is well-formed and located. The subacromial, glenohumeral and acromioclavicular joint spaces are intact. No destructive bony lesions. Soft tissue planes are non-suspicious. IMPRESSION: Negative. Electronically Signed   By: Awilda Metro M.D.   On: 05/19/2017 01:50    Procedures Procedures (including critical care time)  Medications Ordered in ED Medications - No data  to display   Initial Impression / Assessment and Plan / ED Course  I have reviewed the triage vital signs and the nursing notes.  Pertinent labs & imaging results that were available during my care of the patient were reviewed by me and considered in my medical decision making (see chart for details).      Patient examination is consistent with a rotator cuff injury.  He's been given rehabilitation exercises to perform.  If he does not see any difference or improvement.  He's been given a referral to orthopedics for follow-up and formal physical therapy.  I recommended he start these in 5-7 days after the acute injury from a height has healed by taking regular doses of Advil/ibuprofen  Final Clinical Impressions(s) / ED Diagnoses   Final diagnoses:  Contusion of left shoulder, initial encounter  Rotator cuff disorder, left    New Prescriptions New Prescriptions   No medications on file     Earley Favor, NP 05/19/17 4098    Alvira Monday, MD 05/19/17 1728

## 2018-06-14 ENCOUNTER — Encounter (HOSPITAL_COMMUNITY): Payer: Self-pay | Admitting: Emergency Medicine

## 2018-06-14 ENCOUNTER — Emergency Department (HOSPITAL_COMMUNITY): Payer: Self-pay

## 2018-06-14 ENCOUNTER — Other Ambulatory Visit: Payer: Self-pay

## 2018-06-14 ENCOUNTER — Emergency Department (HOSPITAL_COMMUNITY)
Admission: EM | Admit: 2018-06-14 | Discharge: 2018-06-14 | Disposition: A | Discharge status: Home / Self Care | Payer: Self-pay | Attending: Emergency Medicine | Admitting: Emergency Medicine

## 2018-06-14 DIAGNOSIS — M25562 Pain in left knee: Secondary | ICD-10-CM | POA: Insufficient documentation

## 2018-06-14 DIAGNOSIS — F17200 Nicotine dependence, unspecified, uncomplicated: Secondary | ICD-10-CM | POA: Insufficient documentation

## 2018-06-14 NOTE — ED Provider Notes (Signed)
[facility] Note   CSN: [id number] Arrival date & time: 06/14/18  0048     History   Chief Complaint Chief Complaint  Patient presents with  . Knee Pain    HPI Grant Evans is a 27 y.o. male.  HPI 27 year old male with no pertinent past medical history presents to the ED with complaints of left knee pain.  Patient states that this evening he was at work and stepped off a ladder twisting his left ankle causing pain to his left knee.  Patient denies any ankle pain.  Patient states that the knee pain is across the anterior aspect of his knee.  States that 10 years ago he had a laceration to the left knee.  This is caused him intermittent pain.  Patient states pain was worse this evening.  Denies any associated swelling or bruising.  Patient denies any paresthesias or weakness.  He has not taken anything for the pain prior to arrival.  He is able to ambulate with some pain.  Pain is worse with range of motion, palpation and ambulation.  Denies any other associated symptoms.  Patient denies any history of DVT/PE, prolonged immobilization, recent hospitalization or surgeries, leg swelling. History reviewed. No pertinent past medical history.  There are no active problems to display for this patient.   Past Surgical History:  Procedure Laterality Date  . extraction of wisdom teeth          Home Medications    Prior to Admission medications   Medication Sig Start Date End Date Taking? Authorizing Provider  cyclobenzaprine (FLEXERIL) 10 MG tablet Take 1 tablet (10 mg total) by mouth 3 (three) times daily as needed for muscle spasms. 09/04/14   Street, [place], PA-C  HYDROcodone-acetaminophen (NORCO) 5-325 MG per tablet Take 1-2 tablets by mouth every 6 (six) hours as needed for severe pain. 09/04/14   Street, [place], PA-C  ibuprofen (ADVIL,MOTRIN) 600 MG tablet Take 1 tablet (600 mg total) by mouth every 8 (eight) hours as  needed for fever, headache, moderate pain or cramping. 09/04/14   Street, [place], PA-C  Naproxen Sodium (ALEVE) 220 MG CAPS Take 1 capsule by mouth daily as needed. For bad headache    [provider]    Family History Family History  Problem Relation Age of Onset  . CAD Other     Social History Social History   Tobacco Use  . Smoking status: Current Every Day Smoker    Packs/day: 1.00  . Smokeless tobacco: Never Used  Substance Use Topics  . Alcohol use: No  . Drug use: No     Allergies   Amoxicillin and Penicillins   Review of Systems Review of Systems  Musculoskeletal: Positive for arthralgias and myalgias. Negative for joint swelling.  Skin: Negative for color change.  Neurological: Negative for weakness and numbness.     Physical Exam Updated Vital Signs BP 123/74 (BP Location: Right Arm)   Pulse 85   Temp 98.6 F (37 C) (Oral)   Resp 17   Ht 5\' 10"  (1.778 m)   Wt 103 kg (227 lb)   SpO2 98%   BMI 32.57 kg/m   Physical Exam  Constitutional: He appears well-developed and well-nourished. No distress.  HENT:  Head: Normocephalic and atraumatic.  Eyes: Right eye exhibits no discharge. Left eye exhibits no discharge. No scleral icterus.  Neck: Normal range of motion.  Pulmonary/Chest: No respiratory distress.  Musculoskeletal: Normal range of motion.  Left knee: He exhibits normal range of motion, no swelling, no effusion, no ecchymosis, no deformity, no laceration, no erythema, normal alignment, no LCL laxity, normal patellar mobility, no bony tenderness, normal meniscus and no MCL laxity. Tenderness found. Medial joint line, lateral joint line and patellar tendon tenderness noted.  Full range of motion of left knee and left ankle.  No obvious deformity or ecchymosis.  No edema.  No calf tenderness.  DP pulses 2+ bilaterally.  Sensation intact.  Brisk cap refill.  Neurological: He is alert.  Skin: Skin is warm and dry. Capillary refill takes  less than 2 seconds. No pallor.  Psychiatric: His behavior is normal. Judgment and thought content normal.  Nursing note and vitals reviewed.    ED Treatments / Results  Labs (all labs ordered are listed, but only abnormal results are displayed) Labs Reviewed - No data to display  EKG None  Radiology Dg Knee Complete 4 Views Left  Result Date: 06/14/2018 CLINICAL DATA:  Left knee pain after stepping wrong off a ladder today. EXAM: LEFT KNEE - COMPLETE 4+ VIEW COMPARISON:  None. FINDINGS: No evidence of fracture, dislocation, or joint effusion. No evidence of arthropathy or other focal bone abnormality. Soft tissues are unremarkable. IMPRESSION: Negative radiographs of the left knee. Electronically Signed   By: Rubye Oaks M.D.   On: 06/14/2018 01:36    Procedures Procedures (including critical care time)  Medications Ordered in ED Medications - No data to display   Initial Impression / Assessment and Plan / ED Course  I have reviewed the triage vital signs and the nursing notes.  Pertinent labs & imaging results that were available during my care of the patient were reviewed by me and considered in my medical decision making (see chart for details).     The ED with vague left knee pain.  Patient neurovascularly intact.  Patient X-Ray negative for obvious fracture or dislocation. Pain managed in ED. this seems chronic in nature.  There is no leg swelling concerning for DVT.  Offered ultrasound in the outpatient setting patient declined.  Will give knee immobilizer.  Pt advised to follow up with orthopedics if symptoms persist for possibility of missed fracture diagnosis. Patient given brace while in ED, conservative therapy recommended and discussed. Patient will be dc home & is agreeable with above plan.  Pt is hemodynamically stable, in NAD, & able to ambulate in the ED. Evaluation does not show pathology that would require ongoing emergent intervention or inpatient  treatment. I explained the diagnosis to the patient. Pain has been managed & has no complaints prior to dc. Pt is comfortable with above plan and is stable for discharge at this time. All questions were answered prior to disposition. Strict return precautions for f/u to the ED were discussed. Encouraged follow up with PCP.    Final Clinical Impressions(s) / ED Diagnoses   Final diagnoses:  Acute pain of left knee    ED Discharge Orders    None       Wallace Keller 06/14/18 0252    Little, Ambrose Finland, MD 06/14/18 1251

## 2018-06-14 NOTE — Discharge Instructions (Addendum)
X-ray is normal.  Wear the splint for comfort.  Ice the area.  Motrin and Tylenol for pain.  Follow-up with your doctor.

## 2018-06-14 NOTE — Progress Notes (Addendum)
Orthopedic Tech Progress Note Patient Details:  Janeece AgeeJames Alton Heffernan III 1991/03/23 [id number]  Ortho Devices Type of Ortho Device: Knee Immobilizer Ortho Device/Splint Location: lle Ortho Device/Splint Interventions: Ordered, Application, Adjustment  Pt already has crutches at home.   Post Interventions Patient Tolerated: Well Instructions Provided: Care of device, Adjustment of device   Trinna PostMartinez, Chevette Fee J 06/14/2018, 2:28 AM

## 2018-06-14 NOTE — ED Triage Notes (Signed)
Pt reports left knee pain that started 8pm tonight after stepping off a ladder and twisting his ankle. Pt reports ankle pain is gone but states pain hurts when ambulating. No injury to knee. Pt reports left knee injury 10 years ago.

## 2019-10-22 ENCOUNTER — Emergency Department (HOSPITAL_COMMUNITY)
Admission: EM | Admit: 2019-10-22 | Discharge: 2019-10-23 | Disposition: A | Discharge status: Home / Self Care | Payer: Self-pay | Attending: Emergency Medicine | Admitting: Emergency Medicine

## 2019-10-22 ENCOUNTER — Encounter (HOSPITAL_COMMUNITY): Payer: Self-pay | Admitting: Emergency Medicine

## 2019-10-22 ENCOUNTER — Emergency Department (HOSPITAL_COMMUNITY): Payer: Self-pay

## 2019-10-22 ENCOUNTER — Other Ambulatory Visit: Payer: Self-pay

## 2019-10-22 DIAGNOSIS — Z5321 Procedure and treatment not carried out due to patient leaving prior to being seen by health care provider: Secondary | ICD-10-CM | POA: Insufficient documentation

## 2019-10-22 DIAGNOSIS — Y92410 Unspecified street and highway as the place of occurrence of the external cause: Secondary | ICD-10-CM | POA: Insufficient documentation

## 2019-10-22 DIAGNOSIS — Y990 Civilian activity done for income or pay: Secondary | ICD-10-CM | POA: Insufficient documentation

## 2019-10-22 DIAGNOSIS — M25562 Pain in left knee: Secondary | ICD-10-CM | POA: Insufficient documentation

## 2019-10-22 DIAGNOSIS — Y93I9 Activity, other involving external motion: Secondary | ICD-10-CM | POA: Insufficient documentation
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
## 2019-11-09 ENCOUNTER — Other Ambulatory Visit: Payer: Self-pay

## 2019-11-09 DIAGNOSIS — Z20822 Contact with and (suspected) exposure to covid-19: Secondary | ICD-10-CM

## 2019-11-11 LAB — NOVEL CORONAVIRUS, NAA: SARS-CoV-2, NAA: NOT DETECTED

## 2020-06-11 ENCOUNTER — Ambulatory Visit (INDEPENDENT_AMBULATORY_CARE_PROVIDER_SITE_OTHER): Payer: BC Managed Care – PPO

## 2020-06-11 ENCOUNTER — Encounter (HOSPITAL_COMMUNITY): Payer: Self-pay

## 2020-06-11 ENCOUNTER — Other Ambulatory Visit: Payer: Self-pay

## 2020-06-11 ENCOUNTER — Ambulatory Visit (HOSPITAL_COMMUNITY)
Admission: EM | Admit: 2020-06-11 | Discharge: 2020-06-11 | Disposition: A | Discharge status: Home / Self Care | Payer: BC Managed Care – PPO | Attending: Emergency Medicine | Admitting: Emergency Medicine

## 2020-06-11 DIAGNOSIS — M25562 Pain in left knee: Secondary | ICD-10-CM

## 2020-06-11 MED ORDER — MELOXICAM 15 MG PO TABS: 15.0000 mg | ORAL_TABLET | Freq: Every day | ORAL | 0 refills | Status: DC

## 2020-06-11 NOTE — ED Triage Notes (Signed)
Pt presents with recurrent intermittent left knee pain and numbness for past year.

## 2020-06-11 NOTE — Discharge Instructions (Addendum)
Your xray is overall normal and stable here today.  Use of knee brace while active.  Meloxicam daily. Don't take additional ibuprofen for aleve. Take with food.  Activity as tolerated.  Please follow up with orthopedics for further evaluation and management.
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
## 2021-08-18 ENCOUNTER — Emergency Department (HOSPITAL_COMMUNITY): Payer: BC Managed Care – PPO

## 2021-08-18 ENCOUNTER — Other Ambulatory Visit: Payer: Self-pay

## 2021-08-18 ENCOUNTER — Emergency Department (HOSPITAL_COMMUNITY)
Admission: EM | Admit: 2021-08-18 | Discharge: 2021-08-19 | Disposition: A | Discharge status: Home / Self Care | Payer: BC Managed Care – PPO | Attending: Emergency Medicine | Admitting: Emergency Medicine

## 2021-08-18 DIAGNOSIS — W268XXA Contact with other sharp object(s), not elsewhere classified, initial encounter: Secondary | ICD-10-CM | POA: Insufficient documentation

## 2021-08-18 DIAGNOSIS — S61317A Laceration without foreign body of left little finger with damage to nail, initial encounter: Secondary | ICD-10-CM | POA: Insufficient documentation

## 2021-08-18 DIAGNOSIS — F17210 Nicotine dependence, cigarettes, uncomplicated: Secondary | ICD-10-CM | POA: Insufficient documentation

## 2021-08-18 DIAGNOSIS — Y9289 Other specified places as the place of occurrence of the external cause: Secondary | ICD-10-CM | POA: Diagnosis not present

## 2021-08-18 DIAGNOSIS — Z23 Encounter for immunization: Secondary | ICD-10-CM | POA: Insufficient documentation

## 2021-08-18 DIAGNOSIS — Y990 Civilian activity done for income or pay: Secondary | ICD-10-CM | POA: Diagnosis not present

## 2021-08-18 MED ORDER — LIDOCAINE HCL 2 % IJ SOLN
10.0000 mL | Freq: Once | INTRAMUSCULAR | Status: AC
  Administered 2021-08-18: 200 mg via INTRADERMAL
  Filled 2021-08-18: qty 20

## 2021-08-18 MED ORDER — TETANUS-DIPHTH-ACELL PERTUSSIS 5-2.5-18.5 LF-MCG/0.5 IM SUSY
0.5000 mL | PREFILLED_SYRINGE | Freq: Once | INTRAMUSCULAR | Status: AC
  Administered 2021-08-18: 0.5 mL via INTRAMUSCULAR
  Filled 2021-08-18: qty 0.5

## 2021-08-18 NOTE — ED Provider Notes (Signed)
[facility] EMERGENCY DEPARTMENT Provider Note   CSN: [id number] Arrival date & time: 08/18/21  1646     History Chief Complaint  Patient presents with   Finger Injury    Grant Evans is a 30 y.o. male.  Patient is a 30yo male presenting for finger injury. Pt states directly PTA he injured his right 5th digit at the tip after cutting it on an object at work. Patient admits to pain and involvement of the nail bed. Denies any sensation or motor dysfunction.   The history is provided by the patient. No language interpreter was used.      No past medical history on file.  There are no problems to display for this patient.   Past Surgical History:  Procedure Laterality Date   extraction of wisdom teeth         Family History  Problem Relation Age of Onset   CAD Other     Social History   Tobacco Use   Smoking status: Every Day    Packs/day: 1.00    Types: Cigarettes   Smokeless tobacco: Never  Substance Use Topics   Alcohol use: No   Drug use: No    Home Medications Prior to Admission medications   Medication Sig Start Date End Date Taking? Authorizing Provider  cyclobenzaprine (FLEXERIL) 10 MG tablet Take 1 tablet (10 mg total) by mouth 3 (three) times daily as needed for muscle spasms. 09/04/14   Street, [place], PA-C  HYDROcodone-acetaminophen (NORCO) 5-325 MG per tablet Take 1-2 tablets by mouth every 6 (six) hours as needed for severe pain. 09/04/14   Street, [place], PA-C  ibuprofen (ADVIL,MOTRIN) 600 MG tablet Take 1 tablet (600 mg total) by mouth every 8 (eight) hours as needed for fever, headache, moderate pain or cramping. 09/04/14   Street, [place], PA-C  meloxicam (MOBIC) 15 MG tablet Take 1 tablet (15 mg total) by mouth daily. 06/11/20   Georgetta Haber, NP  Naproxen Sodium (ALEVE) 220 MG CAPS Take 1 capsule by mouth daily as needed. For bad headache    [provider]    Allergies    Amoxicillin and  Penicillins  Review of Systems   Review of Systems  Constitutional:  Negative for chills and fever.  Respiratory:  Negative for shortness of breath.   Cardiovascular:  Negative for chest pain and palpitations.  Skin:  Negative for color change and rash.  Neurological:  Negative for weakness and numbness.  All other systems reviewed and are negative.  Physical Exam Updated Vital Signs BP 119/84 (BP Location: Left Arm)   Pulse 97   Temp 98.9 F (37.2 C) (Oral)   Resp 16   Ht 5\' 8"  (1.727 m)   Wt 104.3 kg   SpO2 99%   BMI 34.97 kg/m   Physical Exam Vitals and nursing note reviewed.  Constitutional:      Appearance: Normal appearance.  HENT:     Head: Normocephalic and atraumatic.  Cardiovascular:     Rate and Rhythm: Normal rate and regular rhythm.  Pulmonary:     Effort: Pulmonary effort is normal. No respiratory distress.  Skin:    Capillary Refill: Capillary refill takes less than 2 seconds.       Neurological:     General: No focal deficit present.     Mental Status: He is alert and oriented to person, place, and time.     GCS: GCS eye subscore is 4. GCS verbal subscore is  5. GCS motor subscore is 6.     Sensory: Sensation is intact.     Motor: Motor function is intact.    ED Results / Procedures / Treatments   Labs (all labs ordered are listed, but only abnormal results are displayed) Labs Reviewed - No data to display  EKG None  Radiology DG Finger Little Right  Result Date: 08/18/2021 CLINICAL DATA:  Laceration. EXAM: RIGHT LITTLE FINGER 2+V COMPARISON:  None. FINDINGS: There is no evidence of fracture or dislocation. There is no evidence of arthropathy or other focal bone abnormality. Soft tissues are unremarkable. No foreign body identified. IMPRESSION: Negative. Electronically Signed   By: Darliss Cheney M.D.   On: 08/18/2021 19:10    Procedures .Nerve Block  Date/Time: 08/19/2021 12:15 AM Performed by: Franne Forts, DO Authorized by: Franne Forts, DO   Consent:    Consent obtained:  Verbal   Consent given by:  Patient   Risks, benefits, and alternatives were discussed: yes   Universal protocol:    Patient identity confirmed:  Verbally with patient Indications:    Indications:  Pain relief Location:    Nerve block body site: digit.   Laterality:  Left Procedure details:    Anesthetic injected:  Lidocaine 2% w/o epi Post-procedure details:    Procedure completion:  Tolerated well, no immediate complications   Medications Ordered in ED Medications  Tdap (BOOSTRIX) injection 0.5 mL (has no administration in time range)  lidocaine (XYLOCAINE) 2 % (with pres) injection 200 mg (has no administration in time range)    ED Course  I have reviewed the triage vital signs and the nursing notes.  Pertinent labs & imaging results that were available during my care of the patient were reviewed by me and considered in my medical decision making (see chart for details).    MDM Rules/Calculators/A&P   11:05 PM 30yo male presenting for finger injury at work. Pt Aox3, no acute distress, afebrile, with stable vitals. Physical exam demonstrates 1 cm laceration to anterior surface of distal tip of right fifth digit with involvement of the nail. Nailbed intact. No active bleeding. No sensation or motor deficits.   Tdap given in ED -Xray demonstrates no fractures -Digital nerve block applied.  -Wound irrigated no foreign bodies.  -Pt offered sutures and declined.  Patient in no distress and overall condition improved here in the ED. Bacitracin and guaze given in ED and sent home with patient with recommendations to keep wound clean and dry. Follow up for infection provided. Detailed discussions were had with the patient regarding current findings, and need for close f/u with PCP or on call doctor. The patient has been instructed to return immediately if the symptoms worsen in any way for re-evaluation. Patient verbalized  understanding and is in agreement with current care plan. All questions answered prior to discharge.   Final Clinical Impression(s) / ED Diagnoses Final diagnoses:  Laceration of left little finger without foreign body with damage to nail, initial encounter    Rx / DC Orders ED Discharge Orders     None        Franne Forts, DO 08/19/21 0015

## 2021-08-18 NOTE — ED Triage Notes (Signed)
Pt here POV d/t right finger pain causes by a pipe. Pulses intact. Pain 710

## 2021-08-18 NOTE — Discharge Instructions (Signed)
-  Keep area clean and dry -guaze and bacitracin supplied in ED for home use

## 2021-08-18 NOTE — ED Notes (Signed)
Pt stepped outside to smoke

## 2021-08-18 NOTE — ED Notes (Signed)
Patient stepped outside to smoke 

## 2021-08-18 NOTE — ED Provider Notes (Signed)
Emergency Medicine Provider Triage Evaluation Note  Michial Disney III , a 30 y.o. male  was evaluated in triage.  Pt complains of right 5th finger pain after cutting it while at work pta.  Review of Systems  Positive: Right 5th finger pain Negative: fever  Physical Exam  BP 119/84 (BP Location: Left Arm)   Pulse 97   Temp 98.9 F (37.2 C) (Oral)   Resp 16   SpO2 99%  Gen:   Awake, no distress   Resp:  Normal effort  MSK:   Moves extremities without difficulty  Other:  Laceration through the distal tip of the right 5th finger through the nail  Medical Decision Making  Medically screening exam initiated at 6:39 PM.  Appropriate orders placed.  Janeece Agee III was informed that the remainder of the evaluation will be completed by another provider, this initial triage assessment does not replace that evaluation, and the importance of remaining in the ED until their evaluation is complete.     Karrie Meres, PA-C 08/18/21 1840    Franne Forts, DO 08/18/21 2358

## 2021-12-03 IMAGING — CR DG FINGER LITTLE 2+V*R*
3 series · 3 of 3 positions shown · non-contrast
Comparison: None.

CLINICAL DATA: Laceration.

EXAM:
RIGHT LITTLE FINGER 2+V

[finger ap]
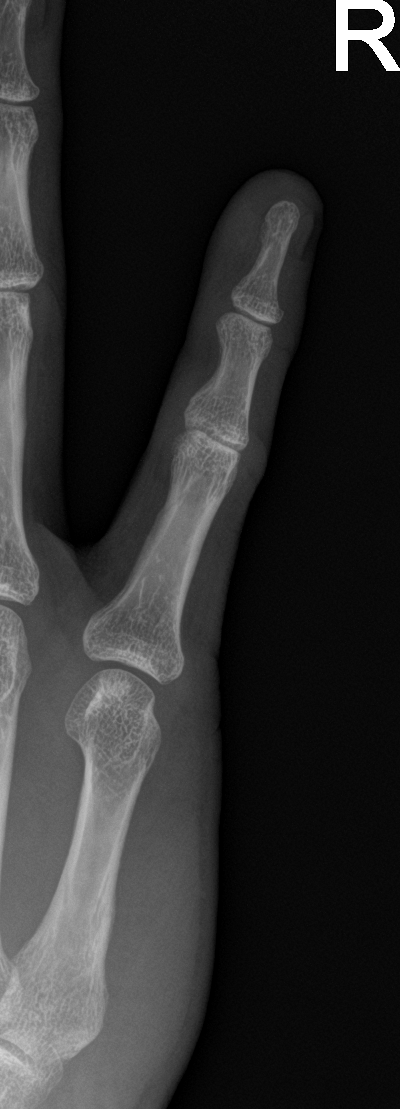

[finger obl]
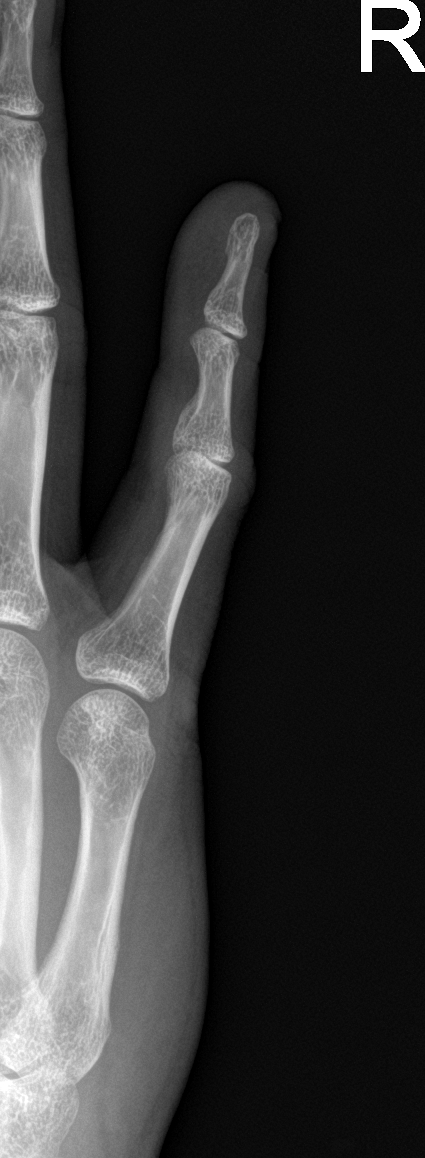

[finger lat]
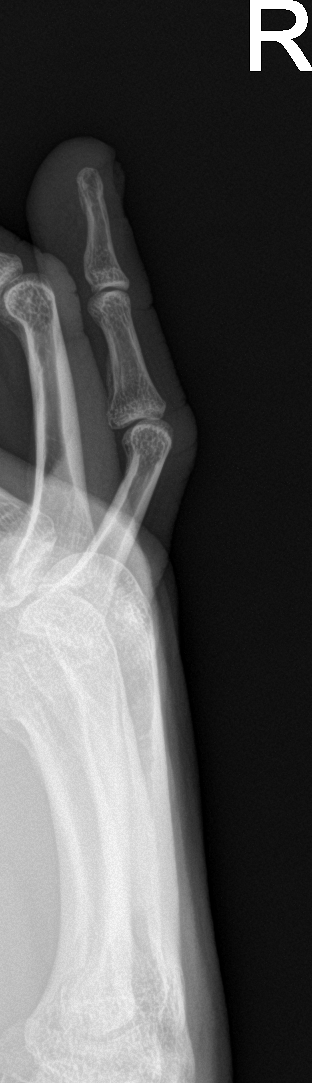

[3 of 3 positions shown; findings below may reference images not displayed]

FINDINGS: There is no evidence of fracture or dislocation. There is no
evidence of arthropathy or other focal bone abnormality. Soft
tissues are unremarkable. No foreign body identified.
IMPRESSION: Negative.

## 2022-07-09 ENCOUNTER — Emergency Department (HOSPITAL_COMMUNITY)
Admission: EM | Admit: 2022-07-09 | Discharge: 2022-07-10 | Disposition: A | Discharge status: Home / Self Care | Payer: Self-pay | Attending: Emergency Medicine | Admitting: Emergency Medicine

## 2022-07-09 ENCOUNTER — Emergency Department (HOSPITAL_COMMUNITY): Payer: Self-pay

## 2022-07-09 ENCOUNTER — Other Ambulatory Visit: Payer: Self-pay

## 2022-07-09 DIAGNOSIS — R209 Unspecified disturbances of skin sensation: Secondary | ICD-10-CM | POA: Insufficient documentation

## 2022-07-09 DIAGNOSIS — D72829 Elevated white blood cell count, unspecified: Secondary | ICD-10-CM | POA: Insufficient documentation

## 2022-07-09 DIAGNOSIS — R0789 Other chest pain: Secondary | ICD-10-CM | POA: Insufficient documentation

## 2022-07-09 LAB — CBC WITH DIFFERENTIAL/PLATELET
Abs Immature Granulocytes: 0.03 10*3/uL (ref 0.00–0.07)
Basophils Absolute: 0.1 10*3/uL (ref 0.0–0.1)
Basophils Relative: 1 %
Eosinophils Absolute: 0.7 10*3/uL — ABNORMAL HIGH (ref 0.0–0.5)
Eosinophils Relative: 7 %
HCT: 42.5 % (ref 39.0–52.0)
Hemoglobin: 14.5 g/dL (ref 13.0–17.0)
Immature Granulocytes: 0 %
Lymphocytes Relative: 27 %
Lymphs Abs: 2.9 10*3/uL (ref 0.7–4.0)
MCH: 30.3 pg (ref 26.0–34.0)
MCHC: 34.1 g/dL (ref 30.0–36.0)
MCV: 88.7 fL (ref 80.0–100.0)
Monocytes Absolute: 0.8 10*3/uL (ref 0.1–1.0)
Monocytes Relative: 8 %
Neutro Abs: 6.1 10*3/uL (ref 1.7–7.7)
Neutrophils Relative %: 57 %
Platelets: 219 10*3/uL (ref 150–400)
RBC: 4.79 MIL/uL (ref 4.22–5.81)
RDW: 12.8 % (ref 11.5–15.5)
WBC: 10.6 10*3/uL — ABNORMAL HIGH (ref 4.0–10.5)
nRBC: 0 % (ref 0.0–0.2)

## 2022-07-09 LAB — BASIC METABOLIC PANEL
Anion gap: 10 (ref 5–15)
BUN: 10 mg/dL (ref 6–20)
CO2: 26 mmol/L (ref 22–32)
Calcium: 9.3 mg/dL (ref 8.9–10.3)
Chloride: 103 mmol/L (ref 98–111)
Creatinine, Ser: 1.17 mg/dL (ref 0.61–1.24)
GFR, Estimated: >60 mL/min (ref >60)
Glucose, Bld: 152 mg/dL — ABNORMAL HIGH (ref 70–99)
Potassium: 3.7 mmol/L (ref 3.5–5.1)
Sodium: 139 mmol/L (ref 135–145)

## 2022-07-09 LAB — TROPONIN I (HIGH SENSITIVITY): Troponin I (High Sensitivity): <2 ng/L (ref <18)

## 2022-07-09 NOTE — ED Provider Triage Note (Signed)
Emergency Medicine Provider Triage Evaluation Note  Grant Evans , a 31 y.o. male  was evaluated in triage.  Pt complains of chest pain.  He states that he has had intermittent, sharp and stabbing chest pain since Wednesday.  States that he is been having stress at work and chest pain seems to get worse with his mood.  He has also had associated, intermittent numbness and tingling to the left arm and left leg.  He denies any back pain, shortness of breath, palpitations..  Review of Systems  Positive:  Negative:   Physical Exam  BP 116/71   Pulse 89   Temp 98.5 F (36.9 C) (Oral)   Resp 16   Ht 5\' 8"  (1.727 m)   Wt 104.3 kg   SpO2 95%   BMI 34.97 kg/m  Gen:   Awake, no distress, appears older than stated age Resp:  Normal effort  MSK:   Moves extremities without difficulty Other:  No pulsatile abdominal mass.  Pulses are equal bilaterally.  S1/S2 without murmur.  Medical Decision Making  Medically screening exam initiated at 9:55 PM.  Appropriate orders placed.  Evans was informed that the remainder of the evaluation will be completed by another provider, this initial triage assessment does not replace that evaluation, and the importance of remaining in the ED until their evaluation is complete.     Janeece Agee, PA-C 07/09/22 2156

## 2022-07-09 NOTE — ED Triage Notes (Signed)
Pt c/o chest pain that has been intermittent since Wednesday worsening with emotional agitation accompanied by persistent numbness left leg and left arm. Ambulatory in triage. Denies SOB

## 2022-07-10 ENCOUNTER — Encounter (HOSPITAL_COMMUNITY): Payer: Self-pay | Admitting: Emergency Medicine

## 2022-07-10 LAB — TROPONIN I (HIGH SENSITIVITY): Troponin I (High Sensitivity): 2 ng/L (ref <18)

## 2022-07-10 MED ORDER — LIDOCAINE 5 % EX PTCH
1.0000 | MEDICATED_PATCH | CUTANEOUS | Status: DC
  Administered 2022-07-10: 1 via TRANSDERMAL
  Filled 2022-07-10: qty 1

## 2022-07-10 MED ORDER — DEXAMETHASONE 4 MG PO TABS
4.0000 mg | ORAL_TABLET | Freq: Once | ORAL | Status: AC
  Administered 2022-07-10: 4 mg via ORAL
  Filled 2022-07-10: qty 1

## 2022-07-10 NOTE — ED Notes (Signed)
Patient verbalizes understanding of discharge instructions. Opportunity for questioning and answers were provided. Armband removed by staff, pt discharged from ED. Pt ambulatory to ED waiting room with steady gait.  

## 2022-07-10 NOTE — ED Notes (Signed)
Pt requesting update, EDP notified.

## 2022-07-10 NOTE — Discharge Instructions (Signed)
You were seen in the ER tonight for your chest pain. Your physical exam and vitals signs, as well as your blood tests were very reassuring. While the exact cause of your symptoms remains unclear, there does not appear to be any emergent problem with your heart or your lungs at this time.  Regarding the numbness in your hand, suspect it is related to some muscle spasm in your shoulder. You were administered a one time dose of steroid in the ER to address this You may also utilize topical pain relief such as Biofreeze, IcyHot, or topical lidocaine patches.  I also recommend that you apply heat to the area, such as a hot shower or heating pad, and follow heat application with massage of the muscles that are most tight. You may also utilize over the counter heart burn medication such as omeprazole or pantoprazole to treat any contributing component of heard burn/reflux.   Please return to the emergency department if you develop any numbness/tingling/weakness in your arms or legs, any difficulty urinating, or urinary incontinence chest pain, shortness of breath, abdominal pain, nausea or vomiting that does not stop, or any other new severe symptoms.

## 2022-07-10 NOTE — ED Provider Notes (Signed)
[facility] EMERGENCY DEPARTMENT Provider Note   CSN: [id number] Arrival date & time: 07/09/22  2126     History  Chief Complaint  Patient presents with   Chest Pain    Grant Evans is a 31 y.o. male who presents with concern for intermittent left-sided chest pain that started Wednesday and worsens with episodes where he becomes emotionally agitated such as today when he had a verbal altercation with the customer.  Intermittently has some numbness and tingling to his left hand as well.  Denies any shortness of breath, chest tightness, palpitations.  States that the pain waxes and wanes but very rarely completely resolves.  Mild worsening today compared to the prior few days and his work directed him to the emergency department.  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  No history of cardiac problems in the past.  No medical diagnoses or medications takes every day.  Currently uninsured but states insurance will kick in later this month from work.  HPI     Home Medications Prior to Admission medications   Medication Sig Start Date End Date Taking? Authorizing Provider  cyclobenzaprine (FLEXERIL) 10 MG tablet Take 1 tablet (10 mg total) by mouth 3 (three) times daily as needed for muscle spasms. 09/04/14   Street, [place], PA-C  HYDROcodone-acetaminophen (NORCO) 5-325 MG per tablet Take 1-2 tablets by mouth every 6 (six) hours as needed for severe pain. 09/04/14   Street, [place], PA-C  ibuprofen (ADVIL,MOTRIN) 600 MG tablet Take 1 tablet (600 mg total) by mouth every 8 (eight) hours as needed for fever, headache, moderate pain or cramping. 09/04/14   Street, [place], PA-C  meloxicam (MOBIC) 15 MG tablet Take 1 tablet (15 mg total) by mouth daily. 06/11/20   Georgetta Haber, NP  Naproxen Sodium (ALEVE) 220 MG CAPS Take 1 capsule by mouth daily as needed. For bad headache    [provider]      Allergies     Amoxicillin and Penicillins    Review of Systems   Review of Systems  Constitutional: Negative.   HENT: Negative.    Respiratory: Negative.    Cardiovascular:  Positive for chest pain.  Gastrointestinal: Negative.   Genitourinary: Negative.   Musculoskeletal: Negative.   Neurological:  Positive for numbness.    Physical Exam Updated Vital Signs BP 131/66   Pulse 80   Temp 98.5 F (36.9 C) (Oral)   Resp (!) 22   Ht 5\' 8"  (1.727 m)   Wt 104.3 kg   SpO2 99%   BMI 34.97 kg/m  Physical Exam Vitals and nursing note reviewed.  Constitutional:      Appearance: He is not ill-appearing or toxic-appearing.  HENT:     Head: Normocephalic and atraumatic.     Mouth/Throat:     Mouth: Mucous membranes are moist.     Pharynx: No oropharyngeal exudate or posterior oropharyngeal erythema.  Eyes:     General:        Right eye: No discharge.        Left eye: No discharge.     Conjunctiva/sclera: Conjunctivae normal.     Pupils: Pupils are equal, round, and reactive to light.  Cardiovascular:     Rate and Rhythm: Normal rate and regular rhythm.     Pulses: Normal pulses.     Heart sounds: Normal heart sounds. No murmur heard. Pulmonary:     Effort: Pulmonary effort is normal. No  respiratory distress.     Breath sounds: Normal breath sounds. No wheezing or rales.  Abdominal:     General: Bowel sounds are normal. There is no distension.     Tenderness: There is no abdominal tenderness.  Musculoskeletal:        General: No deformity.     Cervical back: Neck supple.     Right lower leg: No tenderness. No edema.     Left lower leg: No tenderness. No edema.  Skin:    General: Skin is warm and dry.     Capillary Refill: Capillary refill takes less than 2 seconds.  Neurological:     General: No focal deficit present.     Mental Status: He is alert and oriented to person, place, and time. Mental status is at baseline.     GCS: GCS eye subscore is 4. GCS verbal subscore is 5. GCS  motor subscore is 6.     Cranial Nerves: Cranial nerves 2-12 are intact.     Sensory: Sensation is intact.     Motor: Motor function is intact.     Coordination: Coordination is intact.     Gait: Gait is intact.  Psychiatric:        Mood and Affect: Mood normal.     ED Results / Procedures / Treatments   Labs (all labs ordered are listed, but only abnormal results are displayed) Labs Reviewed  BASIC METABOLIC PANEL - Abnormal; Notable for the following components:      Result Value   Glucose, Bld 152 (*)    All other components within normal limits  CBC WITH DIFFERENTIAL/PLATELET - Abnormal; Notable for the following components:   WBC 10.6 (*)    Eosinophils Absolute 0.7 (*)    All other components within normal limits  TROPONIN I (HIGH SENSITIVITY)  TROPONIN I (HIGH SENSITIVITY)    EKG EKG Interpretation  Date/Time:  Friday July 09 2022 21:37:36 EDT Ventricular Rate:  90 PR Interval:  134 QRS Duration: 86 QT Interval:  346 QTC Calculation: 423 R Axis:   60 Text Interpretation: Normal sinus rhythm Normal ECG No previous ECGs available Confirmed by Drema Pry [phone]) on 07/10/2022 2:12:58 AM  Radiology DG Chest 2 View  Result Date: 07/09/2022 CLINICAL DATA:  Chest pain EXAM: CHEST - 2 VIEW COMPARISON:  None Available. FINDINGS: The heart size and mediastinal contours are within normal limits. Both lungs are clear. The visualized skeletal structures are unremarkable. IMPRESSION: Normal study. Electronically Signed   By: Charlett Nose M.D.   On: 07/09/2022 22:19    Procedures Procedures    Medications Ordered in ED Medications  lidocaine (LIDODERM) 5 % 1 patch (1 patch Transdermal Patch Applied 07/10/22 0243)  dexamethasone (DECADRON) tablet 4 mg (4 mg Oral Given 07/10/22 0243)    ED Course/ Medical Decision Making/ A&P                           Medical Decision Making 31 year old male presents with concern for chest pain for the last 3 days.  Vital signs are  normal and intake.  Cardiopulmonary exam is normal, abdominal exam is benign.  Patient is PERC negative at time of my evaluation.  Normal neurologic status on exam as well.  Differential gnosis includes was limited to ACS, PE, pleural effusion, dysrhythmia, GERD, pneumothorax, pericarditis, in regards to his numbness tingling could also be radicular pain from C-spine.    Amount and/or Complexity of Data Reviewed  Labs:     Details: CBC with mild leukocytosis of 10.6, no anemia.  BMP unremarkable, troponin negative x2. Radiology:     Details: Chest x-ray visualized by this provider is negative for acute cardiopulmonary disease. ECG/medicine tests:     Details: EKG normal sinus rhythm.  Risk Prescription drug management.    While the exact etiology of patient symptoms remains unclear does not appear to be any emergent problem at this time.  Clinical concern for PE or ACS at this time is exceedingly low.  Offered a GI cocktail, patient declined.  Suspect possible contribution of reflux.  Regarding the tingling in the patient's hand the muscles in the left trapezius and cervical paraspinous musculature and spasm; suspect radicular symptoms at this time.  Offered steroid burst and outpatient setting, patient stated he could not afford this therefore one-time dose of steroid was offered in the emergency department.  Recommend close outpatient follow-up with his PCP as well as with cardiologist for whom the information was provided.  No further work-up warranted near this time.  Grant Evans and his partner  voiced understanding of his medical evaluation and treatment plan. Each of their questions answered to their expressed satisfaction.  Return precautions were given.  Patient is well-appearing, stable, and was discharged in good condition.  This chart was dictated using voice recognition software, Dragon. Despite the best efforts of this provider to proofread and correct errors, errors may still occur which  can change documentation meaning.   Final Clinical Impression(s) / ED Diagnoses Final diagnoses:  Atypical chest pain    Rx / DC Orders ED Discharge Orders     None         Paris Lore, PA-C 07/10/22 0245    Nira Conn, MD 07/10/22 [phone]

## 2022-10-06 ENCOUNTER — Other Ambulatory Visit: Payer: Self-pay

## 2022-10-06 ENCOUNTER — Emergency Department (HOSPITAL_COMMUNITY): Payer: Self-pay

## 2022-10-06 ENCOUNTER — Emergency Department (HOSPITAL_COMMUNITY)
Admission: EM | Admit: 2022-10-06 | Discharge: 2022-10-07 | Disposition: A | Discharge status: Home / Self Care | Payer: Self-pay | Attending: Emergency Medicine | Admitting: Emergency Medicine

## 2022-10-06 ENCOUNTER — Encounter (HOSPITAL_COMMUNITY): Payer: Self-pay

## 2022-10-06 DIAGNOSIS — M545 Low back pain, unspecified: Secondary | ICD-10-CM | POA: Insufficient documentation

## 2022-10-06 NOTE — ED Triage Notes (Signed)
Sts lower back pain on going since Sunday. Admits heavy lifting with work. Has been taking NSAIDs with no improvement

## 2022-10-06 NOTE — ED Notes (Signed)
Patient transported to X-ray 

## 2022-10-07 MED ORDER — METHOCARBAMOL 500 MG PO TABS: 500.0000 mg | ORAL_TABLET | Freq: Two times a day (BID) | ORAL | 0 refills | Status: DC

## 2022-10-07 MED ORDER — METHYLPREDNISOLONE 4 MG PO TBPK: ORAL_TABLET | ORAL | 0 refills | Status: DC

## 2022-10-07 NOTE — Discharge Instructions (Addendum)
Take the prescribed medication as directed. Follow-up with spine specialists if you desire.  Can call for appt. Return to the ED for new or worsening symptoms.

## 2022-10-07 NOTE — ED Provider Notes (Signed)
[facility] EMERGENCY DEPARTMENT Provider Note   CSN: [id number] Arrival date & time: 10/06/22  2254     History  Chief Complaint  Patient presents with   Back Pain    Grant Evans is a 31 y.o. male.  The history is provided by the patient and medical records.  Back Pain  31 year old male presenting to the ED with low back pain.  He has had some sporadic issues over the past several years after injury previously.  He was seen at urgent care and told he had some "nerve issues" in his back.  States since Sunday he has been having increased issues.  He does report working in a physically demanding job, does not recall and specific injury.  He reports pain does improve with NSAIDs but states he is concerned as he is not sure what the pain is from.  Sunday he did have some mild pain into the left leg but denies any continuation of that.  Denies any numbness/weakness. No bowel or bladder incontinence.  He has never had any prior back surgeries.  No fever or other infectious symptoms.  Home Medications Prior to Admission medications   Medication Sig Start Date End Date Taking? Authorizing Provider  cyclobenzaprine (FLEXERIL) 10 MG tablet Take 1 tablet (10 mg total) by mouth 3 (three) times daily as needed for muscle spasms. 09/04/14   Street, [place], PA-C  HYDROcodone-acetaminophen (NORCO) 5-325 MG per tablet Take 1-2 tablets by mouth every 6 (six) hours as needed for severe pain. 09/04/14   Street, [place], PA-C  ibuprofen (ADVIL,MOTRIN) 600 MG tablet Take 1 tablet (600 mg total) by mouth every 8 (eight) hours as needed for fever, headache, moderate pain or cramping. 09/04/14   Street, [place], PA-C  meloxicam (MOBIC) 15 MG tablet Take 1 tablet (15 mg total) by mouth daily. 06/11/20   Georgetta Haber, NP  Naproxen Sodium (ALEVE) 220 MG CAPS Take 1 capsule by mouth daily as needed. For bad headache    [provider]      Allergies    Penicillins and  Amoxicillin    Review of Systems   Review of Systems  Musculoskeletal:  Positive for back pain.  All other systems reviewed and are negative.   Physical Exam Updated Vital Signs BP 117/73 (BP Location: Left Arm)   Pulse 75   Temp 97.6 F (36.4 C)   Resp 17   Ht 5\' 8"  (1.727 m)   Wt 104.3 kg   SpO2 100%   BMI 34.97 kg/m   Physical Exam Vitals and nursing note reviewed.  Constitutional:      Appearance: He is well-developed.  HENT:     Head: Normocephalic and atraumatic.  Eyes:     Conjunctiva/sclera: Conjunctivae normal.     Pupils: Pupils are equal, round, and reactive to light.  Cardiovascular:     Rate and Rhythm: Normal rate and regular rhythm.     Heart sounds: Normal heart sounds.  Pulmonary:     Effort: Pulmonary effort is normal.     Breath sounds: Normal breath sounds.  Abdominal:     General: Bowel sounds are normal.     Palpations: Abdomen is soft.  Musculoskeletal:        General: Normal range of motion.     Cervical back: Normal range of motion.     Comments: Tenderness along lower midline lumbar spine, no gross deformity or step-off, full ROM without difficulty, normal strength/sensation of both legs,  normal gait  Skin:    General: Skin is warm and dry.  Neurological:     Mental Status: He is alert and oriented to person, place, and time.     ED Results / Procedures / Treatments   Labs (all labs ordered are listed, but only abnormal results are displayed) Labs Reviewed - No data to display  EKG None  Radiology DG Lumbar Spine Complete  Result Date: 10/06/2022 CLINICAL DATA:  Low back pain EXAM: LUMBAR SPINE - COMPLETE 4+ VIEW COMPARISON:  None FINDINGS: Bilateral L5 pars defects with 7 mm of anterolisthesis of L5 on S1. Disc spaces maintained. No acute fracture. SI joints symmetric and unremarkable. IMPRESSION: Bilateral L5 pars defects with grade 1 anterolisthesis. No acute bony abnormality. Electronically Signed   By: Rolm Baptise M.D.   On:  10/06/2022 23:31    Procedures Procedures    Medications Ordered in ED Medications - No data to display  ED Course/ Medical Decision Making/ A&P                           Medical Decision Making Amount and/or Complexity of Data Reviewed Radiology: ordered and independent interpretation performed.  Risk Prescription drug management.   31 year old male here with low back pain.  Admits to some issues over the past few years, worse over the past few days.  Does have a physically demanding job but denies any specific new injury.  He is afebrile, nontoxic.  Does have some tenderness along the lower lumbar spine, no midline deformity or step-off.  No focal neurologic deficits.  No red flag symptoms.  X-ray was obtained revealing bilateral L5 pars defect with grade 1 anterolisthesis.  No other acute findings.  Suspect likely some overuse.  Appears stable for discharge. Patient reports he has had some mild relief with anti-inflammatories, but still having some discomfort.  Will start Medrol Dosepak, muscle relaxer.  Given referral for neurosurgery if specialty visit desired-- states he wants to check with insurance provider first.  Work note given as well.  Can return here for any new/acute changes.  Final Clinical Impression(s) / ED Diagnoses Final diagnoses:  Acute midline low back pain without sciatica    Rx / DC Orders ED Discharge Orders          Ordered    methylPREDNISolone (MEDROL DOSEPAK) 4 MG TBPK tablet        10/07/22 0041    methocarbamol (ROBAXIN) 500 MG tablet  2 times daily        10/07/22 0041              Larene Pickett, PA-C 10/07/22 0157    Quintella Reichert, MD 10/07/22 [phone]

## 2022-10-12 ENCOUNTER — Other Ambulatory Visit: Payer: Self-pay

## 2022-10-12 ENCOUNTER — Encounter (HOSPITAL_COMMUNITY): Payer: Self-pay | Admitting: Emergency Medicine

## 2022-10-12 ENCOUNTER — Emergency Department (HOSPITAL_COMMUNITY)
Admission: EM | Admit: 2022-10-12 | Discharge: 2022-10-12 | Discharge status: Against Medical Advice | Payer: Self-pay | Attending: Student | Admitting: Student

## 2022-10-12 DIAGNOSIS — Z5321 Procedure and treatment not carried out due to patient leaving prior to being seen by health care provider: Secondary | ICD-10-CM | POA: Insufficient documentation

## 2022-10-12 DIAGNOSIS — M545 Low back pain, unspecified: Secondary | ICD-10-CM | POA: Insufficient documentation

## 2022-10-12 MED ORDER — CYCLOBENZAPRINE HCL 10 MG PO TABS
5.0000 mg | ORAL_TABLET | Freq: Once | ORAL | Status: AC
  Administered 2022-10-12: 5 mg via ORAL
  Filled 2022-10-12: qty 1

## 2022-10-12 NOTE — ED Provider Triage Note (Signed)
Emergency Medicine Provider Triage Evaluation Note  Grant Evans , a 31 y.o. male  was evaluated in triage.  Pt complains of low back pain. Here for second opinion. States he is having ongoing low back pain and "popping sensation." States he does not have insurance or the ability to pay for the medications he was given on 10/06/26 for his same symptoms. He denies any new trauma to the back. Also has not followed up with NSGY for same reason.   Review of Systems  Positive: See above Negative:   Physical Exam  BP 126/78   Pulse 78   Temp 97.8 F (36.6 C) (Oral)   Resp 18   SpO2 99%  Gen:   Awake, no distress   Resp:  Normal effort  MSK:   Moves extremities without difficulty  Other:   Medical Decision Making  Medically screening exam initiated at 1:33 AM.  Appropriate orders placed.  Grant Evans was informed that the remainder of the evaluation will be completed by another provider, this initial triage assessment does not replace that evaluation, and the importance of remaining in the ED until their evaluation is complete.  Placed TOC order for medication assistance    Mickie Hillier, PA-C 10/12/22 [id number]

## 2022-10-12 NOTE — ED Triage Notes (Signed)
Patient here with back pain, states he wants a second opinion about working.  States that he felt a pop in his back after heavy lifting.

## 2022-10-12 NOTE — ED Notes (Signed)
Patient upset over wait time. Patient left before signing MSE

## 2023-05-23 ENCOUNTER — Encounter (HOSPITAL_COMMUNITY): Payer: Self-pay

## 2023-05-23 ENCOUNTER — Emergency Department (HOSPITAL_COMMUNITY): Payer: 59

## 2023-05-23 ENCOUNTER — Other Ambulatory Visit: Payer: Self-pay

## 2023-05-23 ENCOUNTER — Emergency Department (HOSPITAL_COMMUNITY)
Admission: EM | Admit: 2023-05-23 | Discharge: 2023-05-23 | Disposition: A | Discharge status: Home / Self Care | Payer: 59 | Attending: Emergency Medicine | Admitting: Emergency Medicine

## 2023-05-23 DIAGNOSIS — R109 Unspecified abdominal pain: Secondary | ICD-10-CM | POA: Diagnosis not present

## 2023-05-23 DIAGNOSIS — M5442 Lumbago with sciatica, left side: Secondary | ICD-10-CM | POA: Diagnosis not present

## 2023-05-23 DIAGNOSIS — M545 Low back pain, unspecified: Secondary | ICD-10-CM

## 2023-05-23 DIAGNOSIS — S3992XA Unspecified injury of lower back, initial encounter: Secondary | ICD-10-CM | POA: Diagnosis not present

## 2023-05-23 DIAGNOSIS — N281 Cyst of kidney, acquired: Secondary | ICD-10-CM | POA: Diagnosis not present

## 2023-05-23 LAB — I-STAT CHEM 8, ED
BUN: 11 mg/dL (ref 6–20)
Calcium, Ion: 1.17 mmol/L (ref 1.15–1.40)
Chloride: 106 mmol/L (ref 98–111)
Creatinine, Ser: 1 mg/dL (ref 0.61–1.24)
Glucose, Bld: 84 mg/dL (ref 70–99)
HCT: 44 % (ref 39.0–52.0)
Hemoglobin: 15 g/dL (ref 13.0–17.0)
Potassium: 3.8 mmol/L (ref 3.5–5.1)
Sodium: 141 mmol/L (ref 135–145)
TCO2: 26 mmol/L (ref 22–32)

## 2023-05-23 MED ORDER — IOHEXOL 350 MG/ML SOLN
75.0000 mL | Freq: Once | INTRAVENOUS | Status: AC | PRN
  Administered 2023-05-23: 75 mL via INTRAVENOUS

## 2023-05-23 MED ORDER — IBUPROFEN 600 MG PO TABS: 600.0000 mg | ORAL_TABLET | Freq: Four times a day (QID) | ORAL | 0 refills | Status: DC | PRN

## 2023-05-23 MED ORDER — HYDROCODONE-ACETAMINOPHEN 5-325 MG PO TABS: 1.0000 | ORAL_TABLET | Freq: Four times a day (QID) | ORAL | 0 refills | Status: DC | PRN

## 2023-05-23 MED ORDER — METHYLPREDNISOLONE 4 MG PO TBPK: ORAL_TABLET | ORAL | 0 refills | Status: DC

## 2023-05-23 MED ORDER — METHOCARBAMOL 500 MG PO TABS: 500.0000 mg | ORAL_TABLET | Freq: Two times a day (BID) | ORAL | 0 refills | Status: DC

## 2023-05-23 NOTE — ED Notes (Signed)
DC Instructions reviewed with pt.  PT verbalized understanding.  PT DC. 
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
## 2023-08-31 ENCOUNTER — Emergency Department (HOSPITAL_BASED_OUTPATIENT_CLINIC_OR_DEPARTMENT_OTHER): Payer: Worker's Compensation | Admitting: Radiology

## 2023-08-31 ENCOUNTER — Encounter (HOSPITAL_BASED_OUTPATIENT_CLINIC_OR_DEPARTMENT_OTHER): Payer: Self-pay | Admitting: Emergency Medicine

## 2023-08-31 ENCOUNTER — Emergency Department (HOSPITAL_BASED_OUTPATIENT_CLINIC_OR_DEPARTMENT_OTHER)
Admission: EM | Admit: 2023-08-31 | Discharge: 2023-08-31 | Disposition: A | Discharge status: Home / Self Care | Payer: Worker's Compensation | Attending: Emergency Medicine | Admitting: Emergency Medicine

## 2023-08-31 ENCOUNTER — Other Ambulatory Visit: Payer: Self-pay

## 2023-08-31 DIAGNOSIS — S62653A Nondisplaced fracture of medial phalanx of left middle finger, initial encounter for closed fracture: Secondary | ICD-10-CM | POA: Insufficient documentation

## 2023-08-31 DIAGNOSIS — Y990 Civilian activity done for income or pay: Secondary | ICD-10-CM | POA: Insufficient documentation

## 2023-08-31 DIAGNOSIS — S6992XA Unspecified injury of left wrist, hand and finger(s), initial encounter: Secondary | ICD-10-CM | POA: Diagnosis present

## 2023-08-31 DIAGNOSIS — W228XXA Striking against or struck by other objects, initial encounter: Secondary | ICD-10-CM | POA: Diagnosis not present

## 2023-08-31 NOTE — ED Triage Notes (Signed)
Pt c/o LT middle finger injury ~ 2 weeks pta. Abrasion noted to proximal knuckle. Decreased ROM reported

## 2023-08-31 NOTE — ED Notes (Signed)
X-ray at bedside

## 2023-08-31 NOTE — ED Notes (Signed)
Finger splint applied to left middle finger. Pt provided education on how to re-wrap. CNS intact before and after application.

## 2023-08-31 NOTE — ED Provider Notes (Signed)
[place] EMERGENCY DEPARTMENT AT [facility] Provider Note   CSN: [id number] Arrival date & time: 08/31/23  4098     History  Chief Complaint  Patient presents with   Finger Injury    Grant Evans is a 32 y.o. male.  The history is provided by the patient.  Patient presents for finger injury on the left hand.  This occurred approximately 2 weeks ago. Patient reports he was at work when a machine came down on his left hand injuring his left middle and ring finger. At first he did not have much pain, but since that time has had pain and difficulty using his middle finger.  He also reports abrasions to the hand.  He reports tetanus is up-to-date.  No other injuries are reported     Home Medications Prior to Admission medications   Not on File      Allergies    Penicillins and Amoxicillin    Review of Systems   Review of Systems  Musculoskeletal:  Positive for arthralgias.  Skin:  Positive for wound.    Physical Exam Updated Vital Signs BP 131/81   Pulse 92   Temp 98.4 F (36.9 C) (Oral)   Resp 16   Ht 1.778 m (5\' 10" )   Wt 99.8 kg   SpO2 99%   BMI 31.57 kg/m  Physical Exam CONSTITUTIONAL: Well developed/well nourished, no distress HEAD: Normocephalic/atraumatic NEURO: Pt is awake/alert/appropriate, moves all extremitiesx4.  No facial droop.   EXTREMITIES: pulses normal/equal, full ROM Tenderness noted to the left middle and ring finger.  Abrasions are noted.  Full flexion and extension is noted of both fingers but somewhat limited due to pain.  There is mild swelling noted to the left middle finger, see photo SKIN: warm, color normal, see photo PSYCH: no abnormalities of mood noted, alert and oriented to situation    ED Results / Procedures / Treatments   Labs (all labs ordered are listed, but only abnormal results are displayed) Labs Reviewed - No data to display  EKG None  Radiology DG Hand Complete Left  Result Date:  08/31/2023 CLINICAL DATA:  Left middle finger injury, pain EXAM: LEFT HAND - COMPLETE 3+ VIEW COMPARISON:  None Available. FINDINGS: Subtle nondisplaced small fracture at the left middle finger middle phalanx at the PIP joint. Minor soft tissue swelling. No malalignment or joint abnormality. No additional acute osseous finding. No significant arthropathy. No acute finding at the wrist. IMPRESSION: Subtle nondisplaced fracture of the left middle finger middle phalanx at the PIP joint. Electronically Signed   By: Judie Petit.  Shick M.D.   On: 08/31/2023 09:00    Procedures .Ortho Injury Treatment  Date/Time: 08/31/2023 9:30 AM  Performed by: Zadie Rhine, MD Authorized by: Zadie Rhine, MD   Consent:    Consent obtained:  Verbal   Consent given by:  PatientInjury location: finger Location details: left long finger Injury type: fracture Fracture type: middle phalanx Pre-procedure neurovascular assessment: neurovascularly intact Pre-procedure distal perfusion: normal Pre-procedure neurological function: normal Pre-procedure range of motion: reduced Immobilization: splint Splint type: static finger Splint Applied by: ED Nurse Supplies used: aluminum splint Post-procedure neurovascular assessment: post-procedure neurovascularly intact Post-procedure distal perfusion: normal Post-procedure neurological function: normal Post-procedure range of motion: unchanged       Medications Ordered in ED Medications - No data to display  ED Course/ Medical Decision Making/ A&P Clinical Course as of 08/31/23 0953  Wed Aug 31, 2023  0915 Patient found to have a nondisplaced  fracture of the left middle finger.  Will place in finger splint and referred to hand surgery [DW]    Clinical Course User Index [DW] Zadie Rhine, MD                                 Medical Decision Making Amount and/or Complexity of Data Reviewed Radiology: ordered.           Final Clinical Impression(s) /  ED Diagnoses Final diagnoses:  Closed nondisplaced fracture of middle phalanx of left middle finger, initial encounter    Rx / DC Orders ED Discharge Orders     None         Zadie Rhine, MD 08/31/23 [id number]

## 2024-12-18 ENCOUNTER — Emergency Department (HOSPITAL_COMMUNITY): Payer: Self-pay

## 2024-12-18 ENCOUNTER — Other Ambulatory Visit: Payer: Self-pay

## 2024-12-18 ENCOUNTER — Emergency Department (HOSPITAL_COMMUNITY)
Admission: EM | Admit: 2024-12-18 | Discharge: 2024-12-19 | Disposition: A | Discharge status: Home / Self Care | Payer: Self-pay | Attending: Emergency Medicine | Admitting: Emergency Medicine

## 2024-12-18 DIAGNOSIS — M4802 Spinal stenosis, cervical region: Secondary | ICD-10-CM | POA: Insufficient documentation

## 2024-12-18 DIAGNOSIS — M5412 Radiculopathy, cervical region: Secondary | ICD-10-CM | POA: Insufficient documentation

## 2024-12-18 LAB — CBC WITH DIFFERENTIAL/PLATELET
Abs Immature Granulocytes: 0.03 K/uL (ref 0.00–0.07)
Basophils Absolute: 0.1 K/uL (ref 0.0–0.1)
Basophils Relative: 1 %
Eosinophils Absolute: 0.3 K/uL (ref 0.0–0.5)
Eosinophils Relative: 3 %
HCT: 45.3 % (ref 39.0–52.0)
Hemoglobin: 15 g/dL (ref 13.0–17.0)
Immature Granulocytes: 0 %
Lymphocytes Relative: 23 %
Lymphs Abs: 2.8 K/uL (ref 0.7–4.0)
MCH: 29.8 pg (ref 26.0–34.0)
MCHC: 33.1 g/dL (ref 30.0–36.0)
MCV: 89.9 fL (ref 80.0–100.0)
Monocytes Absolute: 0.9 K/uL (ref 0.1–1.0)
Monocytes Relative: 8 %
Neutro Abs: 8 K/uL — ABNORMAL HIGH (ref 1.7–7.7)
Neutrophils Relative %: 65 %
Platelets: 234 K/uL (ref 150–400)
RBC: 5.04 MIL/uL (ref 4.22–5.81)
RDW: 12.8 % (ref 11.5–15.5)
WBC: 12.1 K/uL — ABNORMAL HIGH (ref 4.0–10.5)
nRBC: 0 % (ref 0.0–0.2)

## 2024-12-18 LAB — COMPREHENSIVE METABOLIC PANEL WITH GFR
ALT: 17 U/L (ref 0–44)
AST: 22 U/L (ref 15–41)
Albumin: 4.3 g/dL (ref 3.5–5.0)
Alkaline Phosphatase: 61 U/L (ref 38–126)
Anion gap: 12 (ref 5–15)
BUN: 10 mg/dL (ref 6–20)
CO2: 23 mmol/L (ref 22–32)
Calcium: 9.3 mg/dL (ref 8.9–10.3)
Chloride: 106 mmol/L (ref 98–111)
Creatinine, Ser: 1 mg/dL (ref 0.61–1.24)
GFR, Estimated: >60 mL/min
Glucose, Bld: 159 mg/dL — ABNORMAL HIGH (ref 70–99)
Potassium: 3.9 mmol/L (ref 3.5–5.1)
Sodium: 141 mmol/L (ref 135–145)
Total Bilirubin: 0.5 mg/dL (ref 0.0–1.2)
Total Protein: 7.2 g/dL (ref 6.5–8.1)

## 2024-12-18 LAB — MAGNESIUM: Magnesium: 2.3 mg/dL (ref 1.7–2.4)

## 2024-12-18 LAB — TROPONIN T, HIGH SENSITIVITY: Troponin T High Sensitivity: <15 ng/L (ref 0–19)

## 2024-12-18 NOTE — ED Triage Notes (Signed)
 Pt arrive POV from home with c/o left shoulder and left arm numbness for over a week getting worse today. Pt denies any injury.

## 2024-12-18 NOTE — ED Provider Triage Note (Signed)
 Emergency Medicine Provider Triage Evaluation Note  Sinjin Amero III , a 34 y.o. male  was evaluated in triage.  Pt complains of numbness to the left arm increasing for the past 2 days.  States arm feels heavy.  Also notes he has had intermittent chest pain.  States the left side of his face has felt tingling as well.  Notes intermittent headaches.  Review of Systems  Positive: Numbness, weakness, chest pain Negative: Shortness of breath, vomiting  Physical Exam  BP 125/67 (BP Location: Right Arm)   Pulse 97   Temp 99 F (37.2 C) (Oral)   Resp 18   Ht 5' 10 (1.778 m)   Wt 99.8 kg   SpO2 96%   BMI 31.57 kg/m  Gen:   Awake, no distress   Resp:  Normal effort  MSK:   Moves extremities without difficulty  Other:   Subjective numbness to the left face and left arm.  Medical Decision Making  Medically screening exam initiated at 9:26 PM.  Appropriate orders placed.  Lynwood Corinthia Slade III was informed that the remainder of the evaluation will be completed by another provider, this initial triage assessment does not replace that evaluation, and the importance of remaining in the ED until their evaluation is complete.     Neysa Thersia RAMAN, [place] 12/18/24 2129

## 2024-12-18 NOTE — ED Triage Notes (Signed)
"   Pt reports numbness to left arm x 2 weeks. Reports heavy moving around time symptoms started. Pt also reports chills for days.  "

## 2024-12-19 LAB — TROPONIN T, HIGH SENSITIVITY: Troponin T High Sensitivity: <15 ng/L (ref 0–19)

## 2024-12-19 MED ORDER — GABAPENTIN 100 MG PO CAPS: 100.0000 mg | ORAL_CAPSULE | Freq: Three times a day (TID) | ORAL | 0 refills | Status: AC | PRN

## 2024-12-19 MED ORDER — METHYLPREDNISOLONE 4 MG PO TBPK: ORAL_TABLET | ORAL | 0 refills | Status: AC

## 2024-12-19 MED ORDER — FAMOTIDINE 20 MG PO TABS: 20.0000 mg | ORAL_TABLET | Freq: Two times a day (BID) | ORAL | 0 refills | Status: AC

## 2024-12-19 MED ORDER — IBUPROFEN 600 MG PO TABS: 600.0000 mg | ORAL_TABLET | Freq: Four times a day (QID) | ORAL | 0 refills | Status: AC | PRN

## 2024-12-19 NOTE — ED Provider Notes (Signed)
 " [facility] Provider Note   CSN: [id number] Arrival date & time: 12/18/24  2058     Patient presents with: Numbness   Grant Evans is a 34 y.o. male.   The history is provided by the patient and medical records.  Grant Evans is a 34 y.o. male who presents to the Emergency Department complaining of numbness.  He presents to the emergency department for evaluation of 1 week of intermittent numbness to the left upper extremity.  Numbness starts at the shoulder and radiates down to the digits.  It is most significant in his 3rd through 5th digit.  He describes it as a tingling sensation.  At times it radiates to the left side of his face.  He states that 1 week ago he thinks he may have hurt himself when loading a Harley onto a trailer.  No fevers.  He is getting clammy and sweating at times but is cold during these episodes.  No chest pain, difficulty breathing, nausea, vomiting. He is not having any weakness.  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 Authorizing Provider  famotidine  (PEPCID ) 20 MG tablet Take 1 tablet (20 mg total) by mouth 2 (two) times daily. 12/19/24  Yes Griselda Norris, MD  gabapentin  (NEURONTIN ) 100 MG capsule Take 1 capsule (100 mg total) by mouth 3 (three) times daily as needed (pain). 12/19/24  Yes Griselda Norris, MD  ibuprofen  (ADVIL ) 600 MG tablet Take 1 tablet (600 mg total) by mouth every 6 (six) hours as needed. 12/19/24  Yes Griselda Norris, MD  methylPREDNISolone  (MEDROL  DOSEPAK) 4 MG TBPK tablet Take according to label instructions. 12/19/24  Yes Griselda Norris, MD    Allergies: Penicillins and Amoxicillin    Review of Systems  All other systems reviewed and are negative.   Updated Vital Signs BP (!) 105/58   Pulse 80   Temp 98.2 F (36.8 C)   Resp 17   Ht 5' 10 (1.778 m)    Wt 99.8 kg   SpO2 98%   BMI 31.57 kg/m   Physical Exam Vitals and nursing note reviewed.  Constitutional:      Appearance: He is well-developed.  HENT:     Head: Normocephalic and atraumatic.  Cardiovascular:     Rate and Rhythm: Normal rate and regular rhythm.     Heart sounds: No murmur heard. Pulmonary:     Effort: Pulmonary effort is normal. No respiratory distress.     Breath sounds: Normal breath sounds.  Musculoskeletal:        General: No tenderness.     Cervical back: Neck supple.  Skin:    General: Skin is warm and dry.  Neurological:     Mental Status: He is alert and oriented to person, place, and time.     Comments: No asymmetry of facial movements.  5 out of 5 strength in bilateral upper extremities in proximal and distal muscle groups.  Sensation to light touch intact in all 4 extremities.  5 out of 5 strength in bilateral lower extremities.  Psychiatric:        Behavior: Behavior normal.     (all labs ordered are listed, but only abnormal results are displayed) Labs Reviewed  COMPREHENSIVE METABOLIC PANEL WITH GFR - Abnormal; Notable for the following components:      Result Value  Glucose, Bld 159 (*)    All other components within normal limits  CBC WITH DIFFERENTIAL/PLATELET - Abnormal; Notable for the following components:   WBC 12.1 (*)    Neutro Abs 8.0 (*)    All other components within normal limits  MAGNESIUM  TROPONIN T, HIGH SENSITIVITY  TROPONIN T, HIGH SENSITIVITY    EKG: EKG Interpretation Date/Time:  Tuesday December 18 2024 21:43:35 EST Ventricular Rate:  93 PR Interval:  134 QRS Duration:  84 QT Interval:  340 QTC Calculation: 422 R Axis:   65  Text Interpretation: Normal sinus rhythm Normal ECG Confirmed by Griselda Norris [id number]) on 12/19/2024 5:24:36 AM  Radiology: MR Cervical Spine Wo Contrast Result Date: 12/19/2024 CLINICAL DATA:  Initial evaluation for acute left upper extremity numbness. EXAM: MRI CERVICAL SPINE  WITHOUT CONTRAST TECHNIQUE: Multiplanar, multisequence MR imaging of the cervical spine was performed. No intravenous contrast was administered. COMPARISON:  None Available. FINDINGS: Alignment: Mild reversal of the normal cervical lordosis. Underlying trace dextroscoliosis. No listhesis. Vertebrae: Vertebral body height maintained without acute or chronic fracture. Decreased T1 signal intensity within the visualized bone marrow, nonspecific, but most commonly related to anemia, smoking, or obesity. No worrisome osseous lesions. Minimal degenerative reactive endplate changes present about the C4-5 through C6-7 interspaces. No other abnormal marrow edema. Cord: Normal signal morphology. Posterior Fossa, vertebral arteries, paraspinal tissues: Cerebellar tonsils are mildly low lying at the skull base without overt Chiari malformation. Left maxillary sinusitis noted. Paraspinous soft tissues within normal limits. Preserved flow voids seen within the vertebral arteries bilaterally. Disc levels: C2-C3: Negative interspace.  No canal or foraminal stenosis. C3-C4: Mild disc bulge with superimposed tiny central disc protrusion. Bilateral uncovertebral spurring. No spinal stenosis. Mild to moderate left C4 foraminal stenosis. Right neural foramen remains patent. C4-C5: Diffuse disc bulge with endplate and uncovertebral spurring. Superimposed right paracentral to subarticular disc protrusion flattens and indents the ventral thecal sac (series 5, image 23). Flattening of the right hemi cord without cord signal changes. Mild spinal stenosis. Moderate bilateral C5 foraminal narrowing. C5-C6: Degenerate intervertebral disc space narrowing with diffuse disc osteophyte complex, asymmetric to the right. Posterior component flattens and partially faces the ventral thecal sac. Minimal cord flattening without cord signal changes. Mild spinal stenosis. Mild to moderate right C6 foraminal narrowing. Left neural foramina remains patent.  C6-C7: Degenerative vertebral disc space narrowing with diffuse disc osteophyte complex. Flattening and partial effacement of the ventral thecal sac with resultant mild spinal stenosis. Severe left C7 foraminal narrowing. Right neural foramen remains patent. C7-T1: Negative interspace. Mild left-sided facet hypertrophy. No stenosis. IMPRESSION: 1. Left eccentric disc osteophyte complex at C6-7 with resultant severe left C7 foraminal stenosis. 2. Right paracentral to subarticular disc protrusion with uncovertebral spurring at C4-5 with resultant mild spinal stenosis, with moderate bilateral C5 foraminal narrowing. 3. Right eccentric disc osteophyte complex at C5-6 with resultant mild canal and mild to moderate right C6 foraminal stenosis. Electronically Signed   By: Morene Hoard M.D.   On: 12/19/2024 01:39   MR BRAIN WO CONTRAST Result Date: 12/19/2024 CLINICAL DATA:  Initial evaluation for acute left facial numbness. EXAM: MRI HEAD WITHOUT CONTRAST TECHNIQUE: Multiplanar, multiecho pulse sequences of the brain and surrounding structures were obtained without intravenous contrast. COMPARISON:  None Available. FINDINGS: Brain: Cerebral volume within normal limits for age. No focal parenchymal signal abnormality. No abnormal foci of restricted diffusion to suggest acute or subacute ischemia. Gray-white matter differentiation well maintained. No encephalomalacia to suggest chronic cortical infarction or other  insult. No foci of susceptibility artifact indicative of acute or chronic intracranial blood products. No mass lesion, midline shift or mass effect. Ventricles normal in size and morphology without hydrocephalus. No extra-axial fluid collection. Pituitary gland and suprasellar region within normal limits. Vascular: Major intracranial vascular flow voids are well maintained. Skull and upper cervical spine: Cerebellar tonsils are mildly low lying at the skull base without overt Chiari malformation.  Decreased T1 signal intensity within the visualized bone marrow of the upper cervical spine, nonspecific, but most commonly related to anemia, smoking, or obesity. No scalp soft tissue abnormality. Sinuses/Orbits: Globes and orbital soft tissues are within normal limits. Mucosal thickening with near complete opacification of the left maxillary sinus. Mild scattered mucosal thickening about the ethmoidal air cells. No mastoid effusion. Other: None. IMPRESSION: 1. Normal brain MRI. No acute intracranial abnormality. 2. Left maxillary sinusitis. Electronically Signed   By: Morene Hoard M.D.   On: 12/19/2024 01:32     Procedures   Medications Ordered in the ED - No data to display                                  Medical Decision Making  Patient here for evaluation of paresthesias to the left upper extremity, he is neurologically intact on evaluation.  MRI is significant for severe foraminal stenosis, suspect this is contributing to his symptoms.  There is no evidence of acute infectious process at this time.  Discussed with patient findings of studies.  Feel he is stable to discharge home.  Will start on steroid burst.  Discussed need to follow-up with neurosurgery as an outpatient for additional evaluation.  Return precautions discussed for progressive or concerning symptoms.  Current picture is not consistent with ACS, PE, dissection.     Final diagnoses:  Foraminal stenosis of cervical region  Cervical radiculopathy    ED Discharge Orders          Ordered    methylPREDNISolone  (MEDROL  DOSEPAK) 4 MG TBPK tablet        12/19/24 0553    ibuprofen  (ADVIL ) 600 MG tablet  Every 6 hours PRN        12/19/24 0553    famotidine  (PEPCID ) 20 MG tablet  2 times daily        12/19/24 0553    gabapentin  (NEURONTIN ) 100 MG capsule  3 times daily PRN        12/19/24 0553               Griselda Norris, MD 12/19/24 [phone]  "

## 2024-12-19 NOTE — ED Notes (Signed)
 Called pt 3 times and they did not respond
